# Patient Record
Sex: Male | Born: 1945 | Race: White | Hispanic: No | Marital: Married | Smoking: Current every day smoker
Health system: Southern US, Community
[De-identification: ages and names within clinical notes are randomized; demographics above are authoritative.]

## PROBLEM LIST (undated history)

## (undated) DIAGNOSIS — E7800 Pure hypercholesterolemia, unspecified: Secondary | ICD-10-CM

## (undated) DIAGNOSIS — G894 Chronic pain syndrome: Secondary | ICD-10-CM

## (undated) DIAGNOSIS — E119 Type 2 diabetes mellitus without complications: Secondary | ICD-10-CM

## (undated) DIAGNOSIS — I10 Essential (primary) hypertension: Secondary | ICD-10-CM

## (undated) DIAGNOSIS — M1990 Unspecified osteoarthritis, unspecified site: Secondary | ICD-10-CM

## (undated) HISTORY — DX: Type 2 diabetes mellitus without complications: E11.9

## (undated) HISTORY — DX: Chronic pain syndrome: G89.4

---

## 2002-08-18 ENCOUNTER — Encounter: Payer: Self-pay | Admitting: Orthopaedic Surgery

## 2002-08-18 ENCOUNTER — Ambulatory Visit (HOSPITAL_COMMUNITY): Admission: RE | Admit: 2002-08-18 | Discharge: 2002-08-18 | Payer: Self-pay | Admitting: Orthopaedic Surgery

## 2006-04-18 ENCOUNTER — Encounter: Admission: RE | Admit: 2006-04-18 | Discharge: 2006-04-18 | Payer: Self-pay | Admitting: Family Medicine

## 2008-02-03 ENCOUNTER — Encounter: Admission: RE | Admit: 2008-02-03 | Discharge: 2008-02-03 | Payer: Self-pay | Admitting: Family Medicine

## 2017-12-09 ENCOUNTER — Emergency Department (HOSPITAL_COMMUNITY): Payer: Medicare Other | Admitting: Anesthesiology

## 2017-12-09 ENCOUNTER — Inpatient Hospital Stay (HOSPITAL_COMMUNITY)
Admission: EM | Admit: 2017-12-09 | Discharge: 2017-12-13 | DRG: 493 | Disposition: A | Discharge status: Home Health | Payer: Medicare Other | Attending: Orthopedic Surgery | Admitting: Orthopedic Surgery

## 2017-12-09 ENCOUNTER — Emergency Department (HOSPITAL_COMMUNITY): Payer: Medicare Other

## 2017-12-09 ENCOUNTER — Encounter (HOSPITAL_COMMUNITY): Payer: Self-pay | Admitting: *Deleted

## 2017-12-09 ENCOUNTER — Inpatient Hospital Stay (HOSPITAL_COMMUNITY): Payer: Medicare Other

## 2017-12-09 ENCOUNTER — Other Ambulatory Visit: Payer: Self-pay

## 2017-12-09 ENCOUNTER — Encounter (HOSPITAL_COMMUNITY)
Admission: EM | Disposition: A | Discharge status: Home Health | Payer: Self-pay | Source: Home / Self Care | Attending: Orthopedic Surgery

## 2017-12-09 DIAGNOSIS — T1490XA Injury, unspecified, initial encounter: Secondary | ICD-10-CM

## 2017-12-09 DIAGNOSIS — Z79899 Other long term (current) drug therapy: Secondary | ICD-10-CM

## 2017-12-09 DIAGNOSIS — S82891B Other fracture of right lower leg, initial encounter for open fracture type I or II: Secondary | ICD-10-CM | POA: Diagnosis present

## 2017-12-09 DIAGNOSIS — T148XXA Other injury of unspecified body region, initial encounter: Secondary | ICD-10-CM

## 2017-12-09 DIAGNOSIS — S92211A Displaced fracture of cuboid bone of right foot, initial encounter for closed fracture: Secondary | ICD-10-CM | POA: Diagnosis present

## 2017-12-09 DIAGNOSIS — F17200 Nicotine dependence, unspecified, uncomplicated: Secondary | ICD-10-CM | POA: Diagnosis present

## 2017-12-09 DIAGNOSIS — M25571 Pain in right ankle and joints of right foot: Secondary | ICD-10-CM | POA: Diagnosis present

## 2017-12-09 DIAGNOSIS — S92251B Displaced fracture of navicular [scaphoid] of right foot, initial encounter for open fracture: Secondary | ICD-10-CM | POA: Diagnosis present

## 2017-12-09 DIAGNOSIS — I10 Essential (primary) hypertension: Secondary | ICD-10-CM | POA: Diagnosis present

## 2017-12-09 DIAGNOSIS — S92321A Displaced fracture of second metatarsal bone, right foot, initial encounter for closed fracture: Secondary | ICD-10-CM | POA: Diagnosis present

## 2017-12-09 DIAGNOSIS — R509 Fever, unspecified: Secondary | ICD-10-CM | POA: Diagnosis not present

## 2017-12-09 DIAGNOSIS — W208XXA Other cause of strike by thrown, projected or falling object, initial encounter: Secondary | ICD-10-CM | POA: Diagnosis present

## 2017-12-09 DIAGNOSIS — Y733 Surgical instruments, materials and gastroenterology and urology devices (including sutures) associated with adverse incidents: Secondary | ICD-10-CM

## 2017-12-09 DIAGNOSIS — S82891C Other fracture of right lower leg, initial encounter for open fracture type IIIA, IIIB, or IIIC: Secondary | ICD-10-CM

## 2017-12-09 DIAGNOSIS — S92511B Displaced fracture of proximal phalanx of right lesser toe(s), initial encounter for open fracture: Secondary | ICD-10-CM | POA: Diagnosis present

## 2017-12-09 DIAGNOSIS — S92901B Unspecified fracture of right foot, initial encounter for open fracture: Secondary | ICD-10-CM

## 2017-12-09 DIAGNOSIS — E7800 Pure hypercholesterolemia, unspecified: Secondary | ICD-10-CM | POA: Diagnosis present

## 2017-12-09 DIAGNOSIS — IMO0001 Reserved for inherently not codable concepts without codable children: Secondary | ICD-10-CM

## 2017-12-09 DIAGNOSIS — S82892B Other fracture of left lower leg, initial encounter for open fracture type I or II: Secondary | ICD-10-CM

## 2017-12-09 DIAGNOSIS — S82841B Displaced bimalleolar fracture of right lower leg, initial encounter for open fracture type I or II: Secondary | ICD-10-CM | POA: Diagnosis present

## 2017-12-09 DIAGNOSIS — S92341B Displaced fracture of fourth metatarsal bone, right foot, initial encounter for open fracture: Secondary | ICD-10-CM | POA: Diagnosis present

## 2017-12-09 HISTORY — DX: Pure hypercholesterolemia, unspecified: E78.00

## 2017-12-09 HISTORY — DX: Unspecified osteoarthritis, unspecified site: M19.90

## 2017-12-09 HISTORY — DX: Essential (primary) hypertension: I10

## 2017-12-09 HISTORY — PX: ORIF ANKLE FRACTURE: SHX5408

## 2017-12-09 LAB — CBC WITH DIFFERENTIAL/PLATELET
Basophils Absolute: 0 10*3/uL (ref 0.0–0.1)
Basophils Relative: 0 %
EOS ABS: 0.2 10*3/uL (ref 0.0–0.7)
Eosinophils Relative: 1 %
HEMATOCRIT: 45.5 % (ref 39.0–52.0)
HEMOGLOBIN: 15 g/dL (ref 13.0–17.0)
LYMPHS ABS: 1.7 10*3/uL (ref 0.7–4.0)
Lymphocytes Relative: 11 %
MCH: 31.9 pg (ref 26.0–34.0)
MCHC: 33 g/dL (ref 30.0–36.0)
MCV: 96.8 fL (ref 78.0–100.0)
MONOS PCT: 7 %
Monocytes Absolute: 1.1 10*3/uL — ABNORMAL HIGH (ref 0.1–1.0)
NEUTROS ABS: 12.6 10*3/uL — AB (ref 1.7–7.7)
NEUTROS PCT: 81 %
Platelets: 237 10*3/uL (ref 150–400)
RBC: 4.7 MIL/uL (ref 4.22–5.81)
RDW: 13.1 % (ref 11.5–15.5)
WBC: 15.7 10*3/uL — AB (ref 4.0–10.5)

## 2017-12-09 LAB — COMPREHENSIVE METABOLIC PANEL
ALT: 19 U/L (ref 17–63)
ANION GAP: 12 (ref 5–15)
AST: 30 U/L (ref 15–41)
Albumin: 4.3 g/dL (ref 3.5–5.0)
Alkaline Phosphatase: 71 U/L (ref 38–126)
BUN: 24 mg/dL — ABNORMAL HIGH (ref 6–20)
CHLORIDE: 102 mmol/L (ref 101–111)
CO2: 23 mmol/L (ref 22–32)
Calcium: 9.7 mg/dL (ref 8.9–10.3)
Creatinine, Ser: 1.24 mg/dL (ref 0.61–1.24)
GFR, EST NON AFRICAN AMERICAN: 58 mL/min — AB (ref >60)
Glucose, Bld: 156 mg/dL — ABNORMAL HIGH (ref 65–99)
POTASSIUM: 4.6 mmol/L (ref 3.5–5.1)
SODIUM: 137 mmol/L (ref 135–145)
Total Bilirubin: 1 mg/dL (ref 0.3–1.2)
Total Protein: 7.8 g/dL (ref 6.5–8.1)

## 2017-12-09 LAB — PROTIME-INR
INR: 0.95
PROTHROMBIN TIME: 12.5 s (ref 11.4–15.2)

## 2017-12-09 SURGERY — OPEN REDUCTION INTERNAL FIXATION (ORIF) ANKLE FRACTURE
Anesthesia: Spinal | Site: Ankle | Laterality: Right

## 2017-12-09 MED ORDER — SENNOSIDES-DOCUSATE SODIUM 8.6-50 MG PO TABS: 1.0000 | ORAL_TABLET | Freq: Every evening | ORAL | Status: DC | PRN

## 2017-12-09 MED ORDER — NALOXONE HCL 0.4 MG/ML IJ SOLN: 0.4000 mg | INTRAMUSCULAR | Status: DC | PRN

## 2017-12-09 MED ORDER — ETOMIDATE 2 MG/ML IV SOLN
INTRAVENOUS | Status: AC | PRN
  Administered 2017-12-09: 10 mg via INTRAVENOUS

## 2017-12-09 MED ORDER — BISACODYL 5 MG PO TBEC: 5.0000 mg | DELAYED_RELEASE_TABLET | Freq: Every day | ORAL | Status: DC | PRN

## 2017-12-09 MED ORDER — EPINEPHRINE PF 1 MG/ML IJ SOLN
INTRAMUSCULAR | Status: AC
  Filled 2017-12-09: qty 1

## 2017-12-09 MED ORDER — ROCURONIUM BROMIDE 50 MG/5ML IV SOLN
INTRAVENOUS | Status: AC
  Filled 2017-12-09: qty 1

## 2017-12-09 MED ORDER — LACTATED RINGERS IV SOLN
INTRAVENOUS | Status: DC | PRN
  Administered 2017-12-09 (×2): via INTRAVENOUS

## 2017-12-09 MED ORDER — ETOMIDATE 2 MG/ML IV SOLN
0.3000 mg/kg | Freq: Once | INTRAVENOUS | Status: DC
  Filled 2017-12-09: qty 20

## 2017-12-09 MED ORDER — FENTANYL CITRATE (PF) 100 MCG/2ML IJ SOLN
50.0000 ug | Freq: Once | INTRAMUSCULAR | Status: AC
  Administered 2017-12-09: 50 ug via INTRAVENOUS
  Filled 2017-12-09: qty 2

## 2017-12-09 MED ORDER — MORPHINE SULFATE 15 MG PO TABS: 15.0000 mg | ORAL_TABLET | ORAL | Status: DC | PRN

## 2017-12-09 MED ORDER — ONDANSETRON HCL 4 MG/2ML IJ SOLN
4.0000 mg | Freq: Once | INTRAMUSCULAR | Status: AC
  Administered 2017-12-09: 4 mg via INTRAVENOUS
  Filled 2017-12-09: qty 2

## 2017-12-09 MED ORDER — LISINOPRIL 10 MG PO TABS
10.0000 mg | ORAL_TABLET | Freq: Every day | ORAL | Status: DC
  Administered 2017-12-11 – 2017-12-13 (×3): 10 mg via ORAL
  Filled 2017-12-09 (×4): qty 1

## 2017-12-09 MED ORDER — MORPHINE SULFATE ER 30 MG PO TBCR
30.0000 mg | EXTENDED_RELEASE_TABLET | Freq: Two times a day (BID) | ORAL | Status: DC
  Administered 2017-12-09 – 2017-12-13 (×8): 30 mg via ORAL
  Filled 2017-12-09 (×8): qty 1

## 2017-12-09 MED ORDER — MIDAZOLAM HCL 5 MG/5ML IJ SOLN
INTRAMUSCULAR | Status: DC | PRN
  Administered 2017-12-09 (×2): .5 mg via INTRAVENOUS
  Administered 2017-12-09: 1 mg via INTRAVENOUS

## 2017-12-09 MED ORDER — LIDOCAINE HCL (PF) 1 % IJ SOLN
INTRAMUSCULAR | Status: AC
  Filled 2017-12-09: qty 5

## 2017-12-09 MED ORDER — STERILE WATER FOR IRRIGATION IR SOLN
Status: DC | PRN
  Administered 2017-12-09: 2000 mL

## 2017-12-09 MED ORDER — CEFAZOLIN SODIUM-DEXTROSE 1-4 GM/50ML-% IV SOLN
1.0000 g | Freq: Once | INTRAVENOUS | Status: AC
  Administered 2017-12-09: 1 g via INTRAVENOUS

## 2017-12-09 MED ORDER — SODIUM CHLORIDE 0.9 % IJ SOLN
INTRAMUSCULAR | Status: AC
  Filled 2017-12-09: qty 10

## 2017-12-09 MED ORDER — CEFAZOLIN SODIUM-DEXTROSE 1-4 GM/50ML-% IV SOLN
1.0000 g | Freq: Once | INTRAVENOUS | Status: AC
  Administered 2017-12-09: 1 g via INTRAVENOUS
  Filled 2017-12-09: qty 50

## 2017-12-09 MED ORDER — DOCUSATE SODIUM 100 MG PO CAPS
100.0000 mg | ORAL_CAPSULE | Freq: Two times a day (BID) | ORAL | Status: DC
  Administered 2017-12-10 – 2017-12-13 (×7): 100 mg via ORAL
  Filled 2017-12-09 (×7): qty 1

## 2017-12-09 MED ORDER — LIDOCAINE HCL (CARDIAC) 10 MG/ML IV SOLN
INTRAVENOUS | Status: DC | PRN
  Administered 2017-12-09: 30 mg via INTRAVENOUS

## 2017-12-09 MED ORDER — BUPIVACAINE IN DEXTROSE 0.75-8.25 % IT SOLN
INTRATHECAL | Status: DC | PRN
  Administered 2017-12-09: 15 mg via INTRATHECAL

## 2017-12-09 MED ORDER — MORPHINE SULFATE 15 MG PO TABS: 15.0000 mg | ORAL_TABLET | Freq: Every day | ORAL | Status: DC

## 2017-12-09 MED ORDER — SODIUM CHLORIDE 0.9 % IV BOLUS (SEPSIS)
500.0000 mL | INTRAVENOUS | Status: DC | PRN
  Administered 2017-12-10 – 2017-12-11 (×2): 500 mL via INTRAVENOUS

## 2017-12-09 MED ORDER — POVIDONE-IODINE 10 % EX SWAB: 2.0000 "application " | Freq: Once | CUTANEOUS | Status: DC

## 2017-12-09 MED ORDER — SUCCINYLCHOLINE CHLORIDE 20 MG/ML IJ SOLN
INTRAMUSCULAR | Status: AC
  Filled 2017-12-09: qty 1

## 2017-12-09 MED ORDER — CEFAZOLIN SODIUM 1 G IJ SOLR
1.0000 g | Freq: Once | INTRAMUSCULAR | Status: DC
  Filled 2017-12-09: qty 10

## 2017-12-09 MED ORDER — 0.9 % SODIUM CHLORIDE (POUR BTL) OPTIME
TOPICAL | Status: DC | PRN
  Administered 2017-12-09: 5000 mL

## 2017-12-09 MED ORDER — SODIUM CHLORIDE 0.9 % IV SOLN
INTRAVENOUS | Status: DC
  Administered 2017-12-09 – 2017-12-13 (×6): via INTRAVENOUS

## 2017-12-09 MED ORDER — PROMETHAZINE HCL 12.5 MG PO TABS
12.5000 mg | ORAL_TABLET | Freq: Four times a day (QID) | ORAL | Status: DC | PRN
  Filled 2017-12-09: qty 1

## 2017-12-09 MED ORDER — BUPIVACAINE IN DEXTROSE 0.75-8.25 % IT SOLN
INTRATHECAL | Status: AC
  Filled 2017-12-09: qty 2

## 2017-12-09 MED ORDER — HYDROCHLOROTHIAZIDE 25 MG PO TABS
25.0000 mg | ORAL_TABLET | Freq: Every day | ORAL | Status: DC
  Administered 2017-12-11 – 2017-12-13 (×3): 25 mg via ORAL
  Filled 2017-12-09 (×4): qty 1

## 2017-12-09 MED ORDER — FENTANYL CITRATE (PF) 100 MCG/2ML IJ SOLN
INTRAMUSCULAR | Status: AC
  Filled 2017-12-09: qty 2

## 2017-12-09 MED ORDER — SODIUM CHLORIDE 0.9 % IV BOLUS (SEPSIS)
500.0000 mL | Freq: Once | INTRAVENOUS | Status: AC
  Administered 2017-12-09: 500 mL via INTRAVENOUS

## 2017-12-09 MED ORDER — FENTANYL CITRATE (PF) 100 MCG/2ML IJ SOLN
INTRAMUSCULAR | Status: DC | PRN
  Administered 2017-12-09 (×2): 25 ug via INTRAVENOUS
  Administered 2017-12-09: 15 ug via INTRATHECAL
  Administered 2017-12-09: 25 ug via INTRAVENOUS
  Administered 2017-12-09: 10 ug via INTRAVENOUS
  Administered 2017-12-09 (×4): 25 ug via INTRAVENOUS

## 2017-12-09 MED ORDER — FENTANYL CITRATE (PF) 100 MCG/2ML IJ SOLN
12.5000 ug | INTRAMUSCULAR | Status: DC | PRN
  Administered 2017-12-09 – 2017-12-10 (×4): 12.5 ug via INTRAVENOUS
  Administered 2017-12-10: 6.25 ug via INTRAVENOUS
  Filled 2017-12-09 (×6): qty 2

## 2017-12-09 MED ORDER — PROPOFOL 500 MG/50ML IV EMUL
INTRAVENOUS | Status: DC | PRN
  Administered 2017-12-09: 75 ug/kg/min via INTRAVENOUS

## 2017-12-09 MED ORDER — ENOXAPARIN SODIUM 30 MG/0.3ML ~~LOC~~ SOLN
30.0000 mg | SUBCUTANEOUS | Status: DC
  Administered 2017-12-10 – 2017-12-11 (×2): 30 mg via SUBCUTANEOUS
  Filled 2017-12-09 (×4): qty 0.3

## 2017-12-09 MED ORDER — GABAPENTIN 100 MG PO CAPS
200.0000 mg | ORAL_CAPSULE | Freq: Three times a day (TID) | ORAL | Status: DC
  Administered 2017-12-09 – 2017-12-13 (×11): 200 mg via ORAL
  Filled 2017-12-09 (×11): qty 2

## 2017-12-09 MED ORDER — SODIUM CHLORIDE 0.9 % IV SOLN
INTRAVENOUS | Status: DC
  Administered 2017-12-09: 11:00:00 via INTRAVENOUS

## 2017-12-09 MED ORDER — EPINEPHRINE PF 1 MG/10ML IJ SOSY
PREFILLED_SYRINGE | INTRAMUSCULAR | Status: DC | PRN
  Administered 2017-12-09: .1 ug via INTRATRACHEAL

## 2017-12-09 MED ORDER — PROPOFOL 10 MG/ML IV BOLUS
INTRAVENOUS | Status: AC
  Filled 2017-12-09: qty 20

## 2017-12-09 MED ORDER — MIDAZOLAM HCL 2 MG/2ML IJ SOLN
INTRAMUSCULAR | Status: AC
  Filled 2017-12-09: qty 2

## 2017-12-09 MED ORDER — BUPIVACAINE-EPINEPHRINE (PF) 0.5% -1:200000 IJ SOLN
INTRAMUSCULAR | Status: AC
  Filled 2017-12-09: qty 60

## 2017-12-09 MED ORDER — ACETAMINOPHEN 500 MG PO TABS
500.0000 mg | ORAL_TABLET | ORAL | Status: DC
  Administered 2017-12-09 – 2017-12-13 (×21): 500 mg via ORAL
  Filled 2017-12-09 (×20): qty 1

## 2017-12-09 MED ORDER — CEFAZOLIN SODIUM-DEXTROSE 1-4 GM/50ML-% IV SOLN
INTRAVENOUS | Status: AC
  Filled 2017-12-09: qty 50

## 2017-12-09 MED ORDER — NICOTINE 14 MG/24HR TD PT24
14.0000 mg | MEDICATED_PATCH | Freq: Every day | TRANSDERMAL | Status: DC
  Administered 2017-12-09 – 2017-12-13 (×5): 14 mg via TRANSDERMAL
  Filled 2017-12-09 (×5): qty 1

## 2017-12-09 MED ORDER — IBUPROFEN 400 MG PO TABS
400.0000 mg | ORAL_TABLET | Freq: Four times a day (QID) | ORAL | Status: DC
  Administered 2017-12-10 – 2017-12-13 (×14): 400 mg via ORAL
  Filled 2017-12-09 (×15): qty 1

## 2017-12-09 MED ORDER — CHLORHEXIDINE GLUCONATE 4 % EX LIQD
60.0000 mL | Freq: Once | CUTANEOUS | Status: DC
  Filled 2017-12-09: qty 60

## 2017-12-09 MED ORDER — PROPOFOL 10 MG/ML IV BOLUS
INTRAVENOUS | Status: DC | PRN
  Administered 2017-12-09: 20 mg via INTRAVENOUS

## 2017-12-09 MED ORDER — NICOTINE POLACRILEX 2 MG MT GUM
2.0000 mg | CHEWING_GUM | OROMUCOSAL | Status: DC | PRN
  Filled 2017-12-09: qty 1

## 2017-12-09 MED ORDER — BUPIVACAINE-EPINEPHRINE (PF) 0.5% -1:200000 IJ SOLN
INTRAMUSCULAR | Status: DC | PRN
  Administered 2017-12-09: 30 mL via PERINEURAL

## 2017-12-09 MED ORDER — EPHEDRINE SULFATE 50 MG/ML IJ SOLN
INTRAMUSCULAR | Status: AC
  Filled 2017-12-09: qty 1

## 2017-12-09 MED ORDER — EPHEDRINE SULFATE 50 MG/ML IJ SOLN
INTRAMUSCULAR | Status: DC | PRN
  Administered 2017-12-09 (×2): 5 mg via INTRAVENOUS
  Administered 2017-12-09 (×2): 10 mg via INTRAVENOUS
  Administered 2017-12-09: 5 mg via INTRAVENOUS

## 2017-12-09 MED ORDER — ATORVASTATIN CALCIUM 40 MG PO TABS
40.0000 mg | ORAL_TABLET | Freq: Every day | ORAL | Status: DC
  Administered 2017-12-09 – 2017-12-12 (×4): 40 mg via ORAL
  Filled 2017-12-09 (×4): qty 1

## 2017-12-09 MED ORDER — ONDANSETRON HCL 4 MG/2ML IJ SOLN
INTRAMUSCULAR | Status: AC
  Filled 2017-12-09: qty 2

## 2017-12-09 SURGICAL SUPPLY — 72 items
BANDAGE ELASTIC 3 LF NS (GAUZE/BANDAGES/DRESSINGS) ×3 IMPLANT
BANDAGE ELASTIC 4 LF NS (GAUZE/BANDAGES/DRESSINGS) ×6 IMPLANT
BANDAGE ESMARK 4X12 BL STRL LF (DISPOSABLE) ×1 IMPLANT
BIT DRILL 3.5X122MM AO FIT (BIT) IMPLANT
BIT DRILL CANN 2.7 (BIT) ×1
BIT DRILL CANN 2.7MM (BIT) ×1
BIT DRILL SRG 2.7XCANN AO CPLG (BIT) ×1 IMPLANT
BIT DRL SRG 2.7XCANN AO CPLNG (BIT) ×1
BLADE SURG SZ10 CARB STEEL (BLADE) ×3 IMPLANT
BNDG COHESIVE 4X5 TAN STRL (GAUZE/BANDAGES/DRESSINGS) IMPLANT
BNDG ESMARK 4X12 BLUE STRL LF (DISPOSABLE) ×3
CHLORAPREP W/TINT 26ML (MISCELLANEOUS) ×6 IMPLANT
CLOTH BEACON ORANGE TIMEOUT ST (SAFETY) ×3 IMPLANT
COVER LIGHT HANDLE STERIS (MISCELLANEOUS) ×6 IMPLANT
CUFF TOURNIQUET SINGLE 34IN LL (TOURNIQUET CUFF) ×3 IMPLANT
CUFF TOURNIQUET SINGLE 44IN (TOURNIQUET CUFF) IMPLANT
DECANTER SPIKE VIAL GLASS SM (MISCELLANEOUS) ×6 IMPLANT
DRAPE C-ARM FOLDED MOBILE STRL (DRAPES) ×3 IMPLANT
DRAPE PROXIMA HALF (DRAPES) ×3 IMPLANT
DRESSING XEROFORM 5X9 (GAUZE/BANDAGES/DRESSINGS) ×6 IMPLANT
DRILL 2.6X122MM WL AO SHAFT (BIT) ×3 IMPLANT
GAUZE KERLIX 2X3 DERM STRL LF (GAUZE/BANDAGES/DRESSINGS) ×6 IMPLANT
GAUZE SPONGE 4X4 12PLY STRL (GAUZE/BANDAGES/DRESSINGS) ×6 IMPLANT
GAUZE XEROFORM 5X9 LF (GAUZE/BANDAGES/DRESSINGS) ×6 IMPLANT
GLOVE BIOGEL PI IND STRL 7.0 (GLOVE) ×2 IMPLANT
GLOVE BIOGEL PI INDICATOR 7.0 (GLOVE) ×4
GLOVE SKINSENSE NS SZ8.0 LF (GLOVE) ×4
GLOVE SKINSENSE STRL SZ8.0 LF (GLOVE) ×2 IMPLANT
GLOVE SS N UNI LF 8.5 STRL (GLOVE) ×3 IMPLANT
GOWN STRL REUS W/TWL LRG LVL3 (GOWN DISPOSABLE) ×6 IMPLANT
GOWN STRL REUS W/TWL XL LVL3 (GOWN DISPOSABLE) ×3 IMPLANT
INST SET MINOR BONE (KITS) ×3 IMPLANT
K-WIRE 1.4X100 (WIRE)
K-WIRE 1.6X150 (WIRE)
K-WIRE FX150X1.6XKRSH (WIRE)
K-WIRE ORTHOPEDIC 1.4X150L (WIRE) ×6
K-WIRE SMOOTH 2.0X150 (WIRE)
KIT ROOM TURNOVER APOR (KITS) ×3 IMPLANT
KWIRE 1.4X100 (WIRE) IMPLANT
KWIRE FX150X1.6XKRSH (WIRE) IMPLANT
KWIRE ORTHOPEDIC 1.4X150L (WIRE) ×2 IMPLANT
KWIRE SMOOTH 2.0X150 (WIRE) IMPLANT
MANIFOLD NEPTUNE II (INSTRUMENTS) ×3 IMPLANT
NEEDLE HYPO 21X1.5 SAFETY (NEEDLE) ×3 IMPLANT
NS IRRIG 1000ML POUR BTL (IV SOLUTION) ×15 IMPLANT
PACK BASIC LIMB (CUSTOM PROCEDURE TRAY) ×3 IMPLANT
PAD ABD 5X9 TENDERSORB (GAUZE/BANDAGES/DRESSINGS) ×6 IMPLANT
PAD ARMBOARD 7.5X6 YLW CONV (MISCELLANEOUS) ×3 IMPLANT
PAD CAST 4YDX4 CTTN HI CHSV (CAST SUPPLIES) ×1 IMPLANT
PADDING CAST COTTON 4X4 STRL (CAST SUPPLIES) ×2
PLATE 8H 108MM (Plate) ×3 IMPLANT
SCREW 46X4.0MM (Screw) ×6 IMPLANT
SCREW BONE 14MMX3.5MM (Screw) ×6 IMPLANT
SCREW BONE 3.5X16MM (Screw) ×3 IMPLANT
SCREW BONE NON-LCKING 3.5X12MM (Screw) ×3 IMPLANT
SCREW LOCK 3.5X14 (Screw) ×3 IMPLANT
SCREW LOCKING 3.5X16MM (Screw) ×3 IMPLANT
SCREW LOCKING 3.5X18MM (Screw) ×6 IMPLANT
SCREW NONLOCK 22MM (Screw) ×3 IMPLANT
SET BASIN LINEN APH (SET/KITS/TRAYS/PACK) ×3 IMPLANT
SPLINT J IMMOBILIZER 4X20FT (CAST SUPPLIES) ×1 IMPLANT
SPLINT J PLASTER J 4INX20Y (CAST SUPPLIES) ×2
SPONGE LAP 18X18 X RAY DECT (DISPOSABLE) ×6 IMPLANT
STAPLER VISISTAT 35W (STAPLE) ×6 IMPLANT
SUT ETHIBOND NAB OS 4 #2 30IN (SUTURE) ×6 IMPLANT
SUT MON AB 0 CT1 (SUTURE) ×3 IMPLANT
SUT MON AB 2-0 CT1 36 (SUTURE) ×6 IMPLANT
SYR 30ML LL (SYRINGE) ×3 IMPLANT
SYR BULB IRRIGATION 50ML (SYRINGE) ×3 IMPLANT
TAPE CAST 2X4 WHT DELT L NS LF (GAUZE/BANDAGES/DRESSINGS) ×3 IMPLANT
WATER STERILE IRR 1000ML POUR (IV SOLUTION) ×3 IMPLANT
WATER STERILE IRR 500ML POUR (IV SOLUTION) ×6 IMPLANT

## 2017-12-09 NOTE — Anesthesia Procedure Notes (Signed)
Spinal  Patient location during procedure: OR Start time: 12/09/2017 4:13 PM End time: 12/09/2017 4:16 PM Staffing Resident/CRNA: Franco NonesYates, Vylet Maffia S, CRNA Preanesthetic Checklist Completed: patient identified, site marked, surgical consent, pre-op evaluation, timeout performed, IV checked, risks and benefits discussed and monitors and equipment checked Spinal Block Patient position: right lateral decubitus Prep: Betadine Patient monitoring: heart rate, cardiac monitor, continuous pulse ox and blood pressure Approach: right paramedian Location: L3-4 Injection technique: single-shot Needle Needle type: Spinocan  Needle gauge: 22 G Needle length: 9 cm Assessment Sensory level: T6 Events: clear Additional Notes ATTEMPTS: 1 TRAY IKerin Ransom: GTI: [id number]: [id number] LOT: [phone]  TRAY EXPIRATION DATE: 2020-02-21

## 2017-12-09 NOTE — ED Notes (Signed)
Consent signed and timeout performed.

## 2017-12-09 NOTE — Progress Notes (Signed)
Dr. Romeo AppleHarrison called Rn to see how pts BP was doing, adv it had not been rechecked again but did say son was in hallway to tell RN that pts pain is increasing worse.  Adv Dr. Romeo AppleHarrison I would page him and let him know the pts BP-106/62. MD made aware

## 2017-12-09 NOTE — ED Triage Notes (Signed)
Pt was chopping wood outside and a tree fell back on his right ankle/foot. Pt took 2 Hydrocodone prior to coming to ED. Pt has obvious deformity of right ankle. Pt denies hitting his head or LOC.

## 2017-12-09 NOTE — Anesthesia Preprocedure Evaluation (Addendum)
Anesthesia Evaluation  Patient identified by MRN, date of birth, ID band Patient awake    Reviewed: Allergy & Precautions, NPO status , Patient's Chart, lab work & pertinent test results  Airway Mallampati: III  TM Distance: >3 FB Neck ROM: Limited  Mouth opening: Limited Mouth Opening  Dental  (+) Edentulous Upper, Edentulous Lower   Pulmonary Current Smoker,    breath sounds clear to auscultation       Cardiovascular hypertension, Pt. on medications  Rate:Normal     Neuro/Psych    GI/Hepatic   Endo/Other    Renal/GU      Musculoskeletal  (+) Arthritis ,   Abdominal   Peds  Hematology   Anesthesia Other Findings   Reproductive/Obstetrics                             Anesthesia Physical Anesthesia Plan  ASA: II and emergent  Anesthesia Plan: Spinal   Post-op Pain Management:    Induction:   PONV Risk Score and Plan:   Airway Management Planned: Simple Face Mask  Additional Equipment:   Intra-op Plan:   Post-operative Plan:   Informed Consent: I have reviewed the patients History and Physical, chart, labs and discussed the procedure including the risks, benefits and alternatives for the proposed anesthesia with the patient or authorized representative who has indicated his/her understanding and acceptance.   Dental advisory given  Plan Discussed with: Anesthesiologist and Surgeon  Anesthesia Plan Comments:        Anesthesia Quick Evaluation

## 2017-12-09 NOTE — Anesthesia Postprocedure Evaluation (Signed)
Anesthesia Post Note  Patient: Russell Luna  Procedure(s) Performed: OPEN REDUCTION INTERNAL FIXATION (ORIF) ANKLE FRACTURE (Right Ankle)  Patient location during evaluation: PACU Anesthesia Type: Spinal Level of consciousness: awake and alert Pain management: satisfactory to patient Vital Signs Assessment: post-procedure vital signs reviewed and stable Respiratory status: spontaneous breathing Cardiovascular status: stable Postop Assessment: spinal receding and no apparent nausea or vomiting Anesthetic complications: no     Last Vitals:  Vitals:   12/09/17 1945 12/09/17 2000  BP: (!) 80/48 (!) 88/53  Pulse: 83 87  Resp: 16 10  Temp:    SpO2: 93% 96%    Last Pain:  Vitals:   12/09/17 1419  TempSrc:   PainSc: 7                  Russell Luna

## 2017-12-09 NOTE — ED Notes (Signed)
Pt a/o  

## 2017-12-09 NOTE — ED Provider Notes (Signed)
[facility] EMERGENCY DEPARTMENT Provider Note   CSN: [id number] Arrival date & time: 12/09/17  1053     History   Chief Complaint Chief Complaint  Patient presents with  . Ankle Pain    HPI Russell Luna is a 71 y.o. male.  Patient using chainsaw to cut up with down tree.  Sure he rolled over on his right ankle and foot.  Patient brought in by his wife.  Patient's past medical history significant for high cholesterol and hypertension.  Patient also has a history of chronic back pain and neck pain.  No other injuries other than to his right ankle and foot.  Extensive injuries there are obvious soft tissue lacerations deformity of the ankle foot so clearly a dislocation and there is a 6 cm open wound on the medial aspect of the ankle that most likely represents open fracture or open dislocation.  Patient's cap refill is fine.  Patient is not on blood thinners.  Patient states his tetanus is up-to-date.  Patient with complaint of back pain but no worse after the injury.  This is baseline back pain for him.      Past Medical History:  Diagnosis Date  . Arthritis   . High cholesterol   . Hypertension     There are no active problems to display for this patient.   History reviewed. No pertinent surgical history.     Home Medications    Prior to Admission medications   Not on File    Family History No family history on file.  Social History Social History   Tobacco Use  . Smoking status: Smoker, Current Status Unknown  . Smokeless tobacco: Never Used  Substance Use Topics  . Alcohol use: No    Frequency: Never  . Drug use: No     Allergies   Patient has no known allergies.   Review of Systems Review of Systems  Constitutional: Negative for fever.  HENT: Negative for congestion.   Eyes: Negative for redness.  Respiratory: Negative for shortness of breath.   Cardiovascular: Negative for chest pain.  Gastrointestinal: Negative for abdominal pain,  nausea and vomiting.  Genitourinary: Negative for dysuria.  Musculoskeletal: Positive for back pain and joint swelling. Negative for neck pain.  Skin: Positive for wound.  Neurological: Negative for headaches.  Hematological: Does not bruise/bleed easily.  Psychiatric/Behavioral: Negative for confusion.     Physical Exam Updated Vital Signs BP 129/70   Pulse 82   Temp 97.7 F (36.5 C) (Oral)   Resp (!) 9   Wt 93 kg (205 lb)   SpO2 98%   Physical Exam  Constitutional: He is oriented to person, place, and time. He appears well-developed and well-nourished. No distress.  HENT:  Head: Normocephalic and atraumatic.  Mouth/Throat: Oropharynx is clear and moist.  Eyes: Conjunctivae and EOM are normal. Pupils are equal, round, and reactive to light.  Neck: Normal range of motion. Neck supple.  Cardiovascular: Normal rate, regular rhythm and normal heart sounds.  Pulmonary/Chest: Effort normal and breath sounds normal.  Abdominal: Soft. Bowel sounds are normal.  Musculoskeletal: Normal range of motion. He exhibits tenderness and deformity.  Obvious dislocation and probable fracture of right ankle.  6 cm medial malleolus laceration seems to extend into the joint.  Multiple lacerations to the lateral aspect of the right foot.  Also appears to be some open fractures at the base of the fourth and fifth toe.  Patient has good cap refill.  Patient has  good movement of the toes.  Sensation grossly intact.  No obvious proximal injuries.  No other extremity injuries.  Neurological: He is alert and oriented to person, place, and time. No cranial nerve deficit or sensory deficit. He exhibits normal muscle tone. Coordination normal.  Skin: Skin is warm.  Nursing note and vitals reviewed.    ED Treatments / Results  Labs (all labs ordered are listed, but only abnormal results are displayed) Labs Reviewed  COMPREHENSIVE METABOLIC PANEL - Abnormal; Notable for the following components:      Result  Value   Glucose, Bld 156 (*)    BUN 24 (*)    GFR calc non Af Amer 58 (*)    All other components within normal limits  CBC WITH DIFFERENTIAL/PLATELET - Abnormal; Notable for the following components:   WBC 15.7 (*)    Neutro Abs 12.6 (*)    Monocytes Absolute 1.1 (*)    All other components within normal limits  PROTIME-INR    EKG  EKG Interpretation  Date/Time:  Monday December 09 2017 11:22:45 EST Ventricular Rate:  94 PR Interval:    QRS Duration: 72 QT Interval:  326 QTC Calculation: 408 R Axis:   77 Text Interpretation:  Sinus rhythm No previous ECGs available Confirmed by Vanetta MuldersZackowski, Maggie Senseney [phone]([id number]) on 12/09/2017 12:09:26 PM       Radiology Dg Chest Port 1 View  Result Date: 12/09/2017 CLINICAL DATA:  Tree fell on leg today. EXAM: PORTABLE CHEST 1 VIEW COMPARISON:  02/03/2008 FINDINGS: The cardiac silhouette, mediastinal and hilar contours are within normal limits and stable. There is tortuosity and calcification of the thoracic aorta. The lungs are clear. No pleural effusion. No pneumothorax. The bony thorax is grossly intact. IMPRESSION: No acute cardiopulmonary findings and intact bony thorax. Electronically Signed   By: Rudie MeyerP.  Gallerani M.D.   On: 12/09/2017 12:07   Dg Ankle Right Port  Result Date: 12/09/2017 CLINICAL DATA:  Right ankle pain after injury today. EXAM: PORTABLE RIGHT ANKLE - 2 VIEW COMPARISON:  None. FINDINGS: There is severe displacement of the talus laterally relative to the distal tibia. Severely displaced medial malleolar fracture is noted as well a severely comminuted and displaced fracture involving the distal fibula. IMPRESSION: Severely displaced medial malleolar fracture is noted as well severely displaced and comminuted distal right fibular fracture. Severe displacement of talus laterally relative to distal tibia is noted. Electronically Signed   By: Lupita RaiderJames  Green Jr, M.D.   On: 12/09/2017 12:09   Dg Foot 2 Views Right  Result Date:  12/09/2017 CLINICAL DATA:  Tree fell on foot. EXAM: RIGHT FOOT - 2 VIEW COMPARISON:  None. FINDINGS: There are fractures of the fourth and fifth proximal phalanges. Nondisplaced fracture involving the second metatarsal neck and distal shaft. Slight widening of the distance between the bases of the first and second metatarsals and also between the medial and middle cuneiforms. Possible fracture involving the base of the second metatarsal. Findings worrisome for a Lisfranc injury. The possible fracture involving the medial cuneiform. Other midfoot fractures are possible. Recommend CT for further evaluation. Complex ankle fractures are noted. IMPRESSION: 1. Fractures of the fourth and fifth proximal phalanges. 2. Nondisplaced fracture involving the second metatarsal neck and distal shaft. 3. Suspect fracture involving the base of the second metatarsal and medial cuneiform with Lisfranc injury. Recommend CT for further evaluation. Electronically Signed   By: Rudie MeyerP.  Gallerani M.D.   On: 12/09/2017 12:13    Procedures Reduction of dislocation Date/Time: 12/09/2017 2:37  PM Performed by: Vanetta MuldersZackowski, Maximum Reiland, MD Authorized by: Vanetta MuldersZackowski, Haylen Shelnutt, MD  Consent: The procedure was performed in an emergent situation. Written consent obtained. Risks and benefits: risks, benefits and alternatives were discussed Consent given by: patient Patient understanding: patient states understanding of the procedure being performed Patient consent: the patient's understanding of the procedure matches consent given Procedure consent: procedure consent matches procedure scheduled Relevant documents: relevant documents present and verified Imaging studies: imaging studies available Required items: required blood products, implants, devices, and special equipment available Patient identity confirmed: verbally with patient and arm band Time out: Immediately prior to procedure a "time out" was called to verify the correct patient,  procedure, equipment, support staff and site/side marked as required. Local anesthesia used: no  Anesthesia: Local anesthesia used: no  Sedation: Patient sedated: yes Sedation type: moderate (conscious) sedation Sedatives: etomidate Analgesia: fentanyl Sedation start date/time: 12/09/2017 12:25 PM Sedation end date/time: 12/09/2017 1:00 PM Vitals: Vital signs were monitored during sedation.  Patient tolerance: Patient tolerated the procedure well with no immediate complications    (including critical care time)  Medications Ordered in ED Medications  0.9 %  sodium chloride infusion ( Intravenous New Bag/Given 12/09/17 1127)  etomidate (AMIDATE) injection 27.9 mg (27.9 mg Intravenous Not Given 12/09/17 1232)  sodium chloride 0.9 % bolus 500 mL (0 mLs Intravenous Stopped 12/09/17 1237)  ondansetron (ZOFRAN) injection 4 mg (4 mg Intravenous Given 12/09/17 1121)  fentaNYL (SUBLIMAZE) injection 50 mcg (50 mcg Intravenous Given 12/09/17 1121)  ceFAZolin (ANCEF) IVPB 1 g/50 mL premix (0 g Intravenous Stopped 12/09/17 1211)  fentaNYL (SUBLIMAZE) injection 50 mcg (50 mcg Intravenous Given 12/09/17 1150)  etomidate (AMIDATE) injection (10 mg Intravenous Given 12/09/17 1224)     Initial Impression / Assessment and Plan / ED Course  I have reviewed the triage vital signs and the nursing notes.  Pertinent labs & imaging results that were available during my care of the patient were reviewed by me and considered in my medical decision making (see chart for details).   Patient with significant injuries to the right foot and ankle.  X-rays reveal the by lateral malleolar ankle fracture should be open fracture also dislocation of the talus out of the mortise.  Fractures to the foot as well.  And suspicious concerns for possible fracture of the talus.  Patient was sedated with etomidate and the dislocation was reduced and patient was placed in a stirrup splint and posterior splint.  Wounds were  covered after being cleaned with nonadherent dressing.  Then wrapped with Kerlix gauze and an Ace wrap.  Reduction without any difficulties.  Patient received 1 g of Ancef.  Discussed with on-call orthopedics Dr. Romeo AppleHarrison.  He will come in to take care of the open fractures.  Requested that we get CT of the foot.  That has been ordered.  Patient much more comfortable after the dislocation was reduced.    Final Clinical Impressions(s) / ED Diagnoses   Final diagnoses:  Injury  Type III open fracture of right ankle, initial encounter  Foot fracture, right, open, initial encounter    ED Discharge Orders    None       Vanetta MuldersZackowski, Kameren Baade, MD 12/09/17 1440

## 2017-12-09 NOTE — Anesthesia Procedure Notes (Signed)
Procedure Name: MAC Date/Time: 12/09/2017 3:15 PM Performed by: Vista Deck, CRNA Pre-anesthesia Checklist: Patient identified, Emergency Drugs available, Suction available, Timeout performed and Patient being monitored Patient Re-evaluated:Patient Re-evaluated prior to induction Oxygen Delivery Method: Nasal Cannula

## 2017-12-09 NOTE — Op Note (Signed)
12/09/2017  6:58 PM  PATIENT:  Russell Luna  71 y.o. male  PRE-OPERATIVE DIAGNOSIS:  open right ankle bimalleolar fracture  POST-OPERATIVE DIAGNOSIS:  open right ankle bimalleolar fracture  Multiple fractures of the foot including open fracture of the proximal phalanx of the third fourth and fifth  Second metatarsal fracture at the neck distal shaft and base closed  Open fourth metatarsal fracture distally  Navicular avulsion fracture cuboid and calcaneus fracture, calcaneus fracture not intra-articular  PROCEDURE:  Procedure(s) with comments: Irrigation and debridement and OPEN REDUCTION INTERNAL FIXATION (ORIF) ANKLE  IRRIGATION AND DEBRIDEMENT AND OPEN TREATMENT OF THIRD FOURTH AND FIFTH PROXIMAL PHALANX FRACTURES AND OPEN FOURTH METATARSAL FRACTURE  Right foot  Operative findings Displaced open medial malleolus fracture  Displaced open lateral malleolus fracture with severe comminution of the distal fibula  Rupture of the anterolateral ankle joint capsule and ligament between the anteroinferior tibia and fibula  Lacerations were as follows  6 cm laceration medially 14 cm laceration lateral foot 2 cm laceration lateral ankle at the fracture site of the lateral malleolus 3 cm laceration between third and fourth digit 4 cm laceration between fourth and fifth digit   Operative procedure was performed as follows Patient was evaluated in the preop area surgical site marked  Patient taken to surgery for spinal anesthesia  Tourniquet was applied to the right thigh splint was removed.  The ankle was prepped and draped sterilely with Betadine  Timeout was taken  The limb was exsanguinated with a 4 inch Esmarch tourniquet was elevated to 300 mmHg  The wounds of the foot were wrapped and kept out of the surgical field  A lateral incision was made anteriorly along the fibula and this was taken down to bone and retracted medially and laterally as needed to get  exposure  The fractured fibula was comminuted with avulsion of the anterior inferior tib-fib ligament  The fracture was irrigated and reduced and a lateral plate was applied taking C-arm shots prior to inserting any screws.  Radiographs were taken after the plate was placed using AO technique starting with nonlocking screws and using some distal locking screws where the bone was comminuted.  I then open the medial side extending the incision and irrigated the wound again irrigated the joint reduced the medial malleolus confirm reduction with x-ray and K wires  I then overdrilled the K wires up placing 2 4.0 cannulated screws  Radiographs and stress testing was performed.  There was subluxation of the talus  I then went back to the lateral side and noted that the fracture was not stable.  I therefore remove the plate reproduce the fracture took a C-arm x-ray confirmed reduction and reapplied the plate using nonlocking and locking screws.  I then took 2 #2 Ethibond sutures and repaired the anterior and anterolateral ankle joint capsule including the anterior inferior tib-fib ligament  After stress testing and further x-rays this gave a stable ankle joint.  (The reason for the subluxation was at the distal fibula was not included in the initial plating secondary to it being mistaken for posterior comminution)  I then covered those wounds and then address the foot.  We did copious amounts of irrigation there and closed those wounds loosely with 2-0 Monocryl and staples  I then reirrigated the lateral and medial wound and closed those wounds with 0 Monocryl laterally 2-0 Monocryl medially and staples  Portions of the medial wound, lateral foot wound interdigital wounds and lateral wound was left open  I did inject some Marcaine into the foot.  I did not inject any medially I injected some laterally  Sterile dressings and posterior and sugar tong splint were applied after final radiographs  were obtained confirming reduction of them ankle mortise and ankle joint.  The patient was taken to recovery room in stable condition  In the recovery room I checked his foot and his third fourth and fifth digit did show some discoloration but capillary refill although sluggish was present and I do expect this to come back to normal with the exception of the small toe which may or may not survive and the family has been made aware

## 2017-12-09 NOTE — Transfer of Care (Signed)
Immediate Anesthesia Transfer of Care Note  Patient: Russell Luna  Procedure(s) Performed: OPEN REDUCTION INTERNAL FIXATION (ORIF) ANKLE FRACTURE (Right Ankle)  Patient Location: PACU  Anesthesia Type:Spinal  Level of Consciousness: awake and alert   Airway & Oxygen Therapy: Patient Spontanous Breathing and non-rebreather face mask  Post-op Assessment: Report given to RN and Post -op Vital signs reviewed and stable  Post vital signs: Reviewed and stable  Last Vitals:  Vitals:   12/09/17 1550 12/09/17 1555  BP: 131/69 (!) 149/82  Pulse:    Resp: 15 20  Temp:    SpO2: 97% 95%    Last Pain:  Vitals:   12/09/17 1419  TempSrc:   PainSc: 7          Complications: No apparent anesthesia complications

## 2017-12-09 NOTE — Interval H&P Note (Signed)
History and Physical Interval Note:  12/09/2017 3:57 PM  Russell Luna  has presented today for surgery, with the diagnosis of open right ankle bimalleolar fracture  The various methods of treatment have been discussed with the patient and family. After consideration of risks, benefits and other options for treatment, the patient has consented to  Procedure(s) with comments: OPEN REDUCTION INTERNAL FIXATION (ORIF) ANKLE FRACTURE (Right) - 4pm as a surgical intervention .  The patient's history has been reviewed, patient examined, no change in status, stable for surgery.  I have reviewed the patient's chart and labs.  Questions were answered to the patient's satisfaction.     Fuller CanadaStanley Harrison

## 2017-12-09 NOTE — Consult Note (Addendum)
Patient ID: Russell Luna, male   DOB: 09-May-1946, 71 y.o.   MRN: [id number]  Chief Complaint  Patient presents with  . Ankle Pain    HPI Russell Luna is a 71 y.o. male.  Came to ER with open fracture right ankle and multiple foot fractures     Review of Systems Review of Systems  Unable to perform ROS: Acuity of condition     Past Medical History:  Diagnosis Date  . Arthritis   . High cholesterol   . Hypertension     History reviewed. No pertinent surgical history.  No family history on file.  Social History Social History   Tobacco Use  . Smoking status: Smoker, Current Status Unknown  . Smokeless tobacco: Never Used  Substance Use Topics  . Alcohol use: No    Frequency: Never  . Drug use: No    No Known Allergies  Current Facility-Administered Medications  Medication Dose Route Frequency Provider Last Rate Last Dose  . 0.9 %  sodium chloride infusion   Intravenous Continuous Vanetta MuldersZackowski, Scott, MD 75 mL/hr at 12/09/17 1127    . etomidate (AMIDATE) injection 27.9 mg  0.3 mg/kg Intravenous Once Vanetta MuldersZackowski, Scott, MD       Current Outpatient Medications  Medication Sig Dispense Refill  . atorvastatin (LIPITOR) 80 MG tablet Take 40 mg by mouth daily.    . hydrochlorothiazide (HYDRODIURIL) 25 MG tablet Take 25 mg by mouth daily.    Marland Kitchen. lisinopril (PRINIVIL,ZESTRIL) 40 MG tablet Take 10 mg by mouth daily.    Marland Kitchen. morphine (MS CONTIN) 30 MG 12 hr tablet Take 30 mg by mouth every 12 (twelve) hours.    Marland Kitchen. morphine (MSIR) 15 MG tablet Take 15 mg by mouth daily.         Physical Exam BP 123/64   Pulse 84   Temp 97.7 F (36.5 C) (Oral)   Resp 12   Wt 205 lb (93 kg)   SpO2 95%  Physical Exam  Constitutional: He appears well-developed and well-nourished. He appears distressed.  HENT:  Head: Normocephalic and atraumatic.  Right Ear: External ear normal.  Left Ear: External ear normal.  Nose: Nose normal.  Mouth/Throat: Oropharynx is clear and moist.  Eyes:  Conjunctivae and EOM are normal. Pupils are equal, round, and reactive to light. Right eye exhibits no discharge. Left eye exhibits no discharge. No scleral icterus.  Neck: Neck supple. No JVD present. No tracheal deviation present. No thyromegaly present.  Cardiovascular: Normal rate, regular rhythm, normal heart sounds and intact distal pulses.  Pulmonary/Chest: Effort normal and breath sounds normal. No stridor.  Abdominal: Soft. Bowel sounds are normal. He exhibits no distension and no mass. There is no tenderness. There is no rebound and no guarding.  Musculoskeletal:       Right shoulder: Normal.       Left shoulder: Normal.       Right elbow: Normal.      Left elbow: Normal.       Right wrist: Normal.       Left wrist: Normal.       Right hip: Normal.       Left hip: Normal.       Right knee: Normal.       Left knee: Normal.       Right ankle: He exhibits decreased range of motion, swelling, ecchymosis, deformity and laceration. He exhibits normal pulse. Tenderness. Lateral malleolus, medial malleolus, AITFL and CF ligament tenderness found. No posterior  TFL and no proximal fibula tenderness found.       Left ankle: Normal.       Right upper arm: Normal.       Left upper arm: Normal.       Right forearm: Normal.       Left forearm: Normal.       Arms:      Right hand: Normal.       Left hand: Normal.       Left upper leg: Normal.       Left lower leg: Normal.       Right foot: There is decreased range of motion, tenderness, bony tenderness, swelling, crepitus, deformity and laceration. There is normal capillary refill.       Left foot: Normal.  Lymphadenopathy:    He has no cervical adenopathy.  Neurological: He is alert. No cranial nerve deficit. He exhibits normal muscle tone. Coordination normal.  Skin: Skin is warm and dry. He is not diaphoretic.  Psychiatric: He has a normal mood and affect. His behavior is normal. Judgment and thought content normal.   The patient is  well developed well nourished and well groomed.  Orientation to person place and time is normal  Mood is pleasant.  Ambulatory status not walking  Ortho Exam   Data Reviewed  @DX @  I reviewed the x-ray and independently interpreted as 3 views of the ankle and there is a  displaced bimalleolar right ankle fracture   Assessment    Ankle fracture:open right ankle fracture   Multiple fractures in the foot     Plan orif right ankle irrigation and debridement   Address foot intraop for I and D   The procedure has been fully reviewed with the patient; The risks and benefits of surgery have been discussed and explained and understood. Alternative treatment has also been reviewed, questions were encouraged and answered. The postoperative plan is also been reviewed.   Addendum  Upper right and left extremities shoulder elbow wrist arm and forearm wrist and hand no tenderness or deformity.  Normal range of motion.  No joint subluxation or dislocation.  Muscle tone normal.  1 skin abrasion right hand none on the left neurovascular exam normal color perfusion capillary refill and pulse normal sensation.  Internal and external stress test of the pelvis was normal.  Left lower extremity hip knee ankle leg and thigh normal range of motion stability strength alignment no tenderness no skin lacerations neurovascular exam intact with good color capillary refill and sensation  Right hip and knee normal range of motion strength stability and alignment no tenderness  Right ankle is splinted.  ER report indicates the medial laceration approximately 5 cm.  Open fracture noted by ER physician  Tetanus status not currently known we will check with the ER.  They gave 1 g of Ancef I gave an additional gram.

## 2017-12-09 NOTE — ED Notes (Signed)
Pt taken to ct 

## 2017-12-09 NOTE — H&P (View-Only) (Signed)
Patient ID: Russell Luna, male   DOB: [birth year], 71 y.o.   MRN: [id number]  Chief Complaint  Patient presents with  . Ankle Pain    HPI Russell Krausshomas K Duford is a 71 y.o. male.  Came to ER with open fracture right ankle and multiple foot fractures     Review of Systems Review of Systems  Unable to perform ROS: Acuity of condition     Past Medical History:  Diagnosis Date  . Arthritis   . High cholesterol   . Hypertension     History reviewed. No pertinent surgical history.  No family history on file.  Social History Social History   Tobacco Use  . Smoking status: Smoker, Current Status Unknown  . Smokeless tobacco: Never Used  Substance Use Topics  . Alcohol use: No    Frequency: Never  . Drug use: No    No Known Allergies  Current Facility-Administered Medications  Medication Dose Route Frequency Provider Last Rate Last Dose  . 0.9 %  sodium chloride infusion   Intravenous Continuous Vanetta MuldersZackowski, Scott, MD 75 mL/hr at 12/09/17 1127    . etomidate (AMIDATE) injection 27.9 mg  0.3 mg/kg Intravenous Once Vanetta MuldersZackowski, Scott, MD       Current Outpatient Medications  Medication Sig Dispense Refill  . atorvastatin (LIPITOR) 80 MG tablet Take 40 mg by mouth daily.    . hydrochlorothiazide (HYDRODIURIL) 25 MG tablet Take 25 mg by mouth daily.    Marland Kitchen. lisinopril (PRINIVIL,ZESTRIL) 40 MG tablet Take 10 mg by mouth daily.    Marland Kitchen. morphine (MS CONTIN) 30 MG 12 hr tablet Take 30 mg by mouth every 12 (twelve) hours.    Marland Kitchen. morphine (MSIR) 15 MG tablet Take 15 mg by mouth daily.         Physical Exam BP 123/64   Pulse 84   Temp 97.7 F (36.5 C) (Oral)   Resp 12   Wt 205 lb (93 kg)   SpO2 95%  Physical Exam  Constitutional: He appears well-developed and well-nourished. He appears distressed.  HENT:  Head: Normocephalic and atraumatic.  Right Ear: External ear normal.  Left Ear: External ear normal.  Nose: Nose normal.  Mouth/Throat: Oropharynx is clear and moist.  Eyes:  Conjunctivae and EOM are normal. Pupils are equal, round, and reactive to light. Right eye exhibits no discharge. Left eye exhibits no discharge. No scleral icterus.  Neck: Neck supple. No JVD present. No tracheal deviation present. No thyromegaly present.  Cardiovascular: Normal rate, regular rhythm, normal heart sounds and intact distal pulses.  Pulmonary/Chest: Effort normal and breath sounds normal. No stridor.  Abdominal: Soft. Bowel sounds are normal. He exhibits no distension and no mass. There is no tenderness. There is no rebound and no guarding.  Musculoskeletal:       Right shoulder: Normal.       Left shoulder: Normal.       Right elbow: Normal.      Left elbow: Normal.       Right wrist: Normal.       Left wrist: Normal.       Right hip: Normal.       Left hip: Normal.       Right knee: Normal.       Left knee: Normal.       Right ankle: He exhibits decreased range of motion, swelling, ecchymosis, deformity and laceration. He exhibits normal pulse. Tenderness. Lateral malleolus, medial malleolus, AITFL and CF ligament tenderness found. No posterior  TFL and no proximal fibula tenderness found.       Left ankle: Normal.       Right upper arm: Normal.       Left upper arm: Normal.       Right forearm: Normal.       Left forearm: Normal.       Arms:      Right hand: Normal.       Left hand: Normal.       Left upper leg: Normal.       Left lower leg: Normal.       Right foot: There is decreased range of motion, tenderness, bony tenderness, swelling, crepitus, deformity and laceration. There is normal capillary refill.       Left foot: Normal.  Lymphadenopathy:    He has no cervical adenopathy.  Neurological: He is alert. No cranial nerve deficit. He exhibits normal muscle tone. Coordination normal.  Skin: Skin is warm and dry. He is not diaphoretic.  Psychiatric: He has a normal mood and affect. His behavior is normal. Judgment and thought content normal.   The patient is  well developed well nourished and well groomed.  Orientation to person place and time is normal  Mood is pleasant.  Ambulatory status not walking  Ortho Exam   Data Reviewed  @DX @  I reviewed the x-ray and independently interpreted as 3 views of the ankle and there is a  displaced bimalleolar right ankle fracture   Assessment    Ankle fracture:open right ankle fracture   Multiple fractures in the foot     Plan orif right ankle irrigation and debridement   Address foot intraop for I and D   The procedure has been fully reviewed with the patient; The risks and benefits of surgery have been discussed and explained and understood. Alternative treatment has also been reviewed, questions were encouraged and answered. The postoperative plan is also been reviewed.

## 2017-12-09 NOTE — ED Notes (Signed)
ED Provider at bedside. 

## 2017-12-09 NOTE — Brief Op Note (Signed)
12/09/2017  6:58 PM  PATIENT:  Jacqlyn Krausshomas K Augsburger  71 y.o. male  PRE-OPERATIVE DIAGNOSIS:  open right ankle bimalleolar fracture  POST-OPERATIVE DIAGNOSIS:  open right ankle bimalleolar fracture  Multiple fractures of the foot including open fracture of the proximal phalanx of the third fourth and fifth  Second metatarsal fracture at the neck distal shaft and base closed  Open fourth metatarsal fracture distally  Navicular avulsion fracture cuboid and calcaneus fracture, calcaneus fracture not intra-articular  PROCEDURE:  Procedure(s) with comments: Irrigation and debridement and OPEN REDUCTION INTERNAL FIXATION (ORIF) ANKLE  IRRIGATION AND DEBRIDEMENT AND OPEN TREATMENT OF THIRD FOURTH AND FIFTH PROXIMAL PHALANX FRACTURES AND OPEN FOURTH METATARSAL FRACTURE  Right foot  Operative findings Displaced open medial malleolus fracture  Displaced open lateral malleolus fracture with severe comminution of the distal fibula  Rupture of the anterolateral ankle joint capsule and ligament between the anteroinferior tibia and fibula  Lacerations were as follows  6 cm laceration medially 14 cm laceration lateral foot 2 cm laceration lateral ankle at the fracture site of the lateral malleolus 3 cm laceration between third and fourth digit 4 cm laceration between fourth and fifth digit   Operative procedure was performed as follows Patient was evaluated in the preop area surgical site marked  Patient taken to surgery for spinal anesthesia  Tourniquet was applied to the right thigh splint was removed.  The ankle was prepped and draped sterilely with Betadine  Timeout was taken  The limb was exsanguinated with a 4 inch Esmarch tourniquet was elevated to 300 mmHg  The wounds of the foot were wrapped and kept out of the surgical field  A lateral incision was made anteriorly along the fibula and this was taken down to bone and retracted medially and laterally as needed to get  exposure  The fractured fibula was comminuted with avulsion of the anterior inferior tib-fib ligament  The fracture was irrigated and reduced and a lateral plate was applied taking C-arm shots prior to inserting any screws.  Radiographs were taken after the plate was placed using AO technique starting with nonlocking screws and using some distal locking screws where the bone was comminuted.  I then open the medial side extending the incision and irrigated the wound again irrigated the joint reduced the medial malleolus confirm reduction with x-ray and K wires  I then overdrilled the K wires up placing 2 4.0 cannulated screws  Radiographs and stress testing was performed.  There was subluxation of the talus  I then went back to the lateral side and noted that the fracture was not stable.  I therefore remove the plate reproduce the fracture took a C-arm x-ray confirmed reduction and reapplied the plate using nonlocking and locking screws.  I then took 2 #2 Ethibond sutures and repaired the anterior and anterolateral ankle joint capsule including the anterior inferior tib-fib ligament  After stress testing and further x-rays this gave a stable ankle joint.  (The reason for the subluxation was at the distal fibula was not included in the initial plating secondary to it being mistaken for posterior comminution)  I then covered those wounds and then address the foot.  We did copious amounts of irrigation there and closed those wounds loosely with 2-0 Monocryl and staples  I then reirrigated the lateral and medial wound and closed those wounds with 0 Monocryl laterally 2-0 Monocryl medially and staples  Portions of the medial wound, lateral foot wound interdigital wounds and lateral wound was left open  I did inject some Marcaine into the foot.  I did not inject any medially I injected some laterally  Sterile dressings and posterior and sugar tong splint were applied after final radiographs  were obtained confirming reduction of them ankle mortise and ankle joint.  The patient was taken to recovery room in stable condition  In the recovery room I checked his foot and his third fourth and fifth digit did show some discoloration but capillary refill although sluggish was present and I do expect this to come back to normal with the exception of the small toe which may or may not survive and the family has been made aware    SURGEON:  Surgeon(s) and Role:    * Vickki HearingHarrison, Stanley E, MD - Primary  PHYSICIAN ASSISTANT:   ASSISTANTS: none   ANESTHESIA:   spinal  EBL:  20 mL   BLOOD ADMINISTERED:none  DRAINS: none   LOCAL MEDICATIONS USED:  MARCAINE     SPECIMEN:  No Specimen  DISPOSITION OF SPECIMEN:  N/A  COUNTS:  YES  TOURNIQUET:   Total Tourniquet Time Documented: Thigh (Right) - 113 minutes Total: Thigh (Right) - 113 minutes   DICTATION: .Reubin Milanragon Dictation  PLAN OF CARE: Admit to inpatient   PATIENT DISPOSITION:  PACU - hemodynamically stable.   Delay start of Pharmacological VTE agent (>24hrs) due to surgical blood loss or risk of bleeding: not applicable

## 2017-12-09 NOTE — Progress Notes (Signed)
This RN spoke with Dr. Romeo AppleHarrison about pts concern for PRN pain medication, BP of 85/33, cool to touch toes, blood drainage and Lovenox administration. Dr. Romeo AppleHarrison stated he was not going to order any PRN medication d/t pt already being on MS Contin 30mg  scheduled (long acting), He adv me not give any BP medication is ordered d/t his low BP and continue to monitor (no parameters given), Also he adv me to hold off on Lovenox until the am. Pharmacy called and made aware of Lovenox dosage.  [facility] called and made aware that pt has order for foot pumps, she stated she would bring them if they had them.

## 2017-12-09 NOTE — Anesthesia Procedure Notes (Signed)
Procedure Name: MAC Date/Time: 12/09/2017 3:59 PM Performed by: Vista Deck, CRNA Pre-anesthesia Checklist: Patient identified, Emergency Drugs available, Suction available, Timeout performed and Patient being monitored Patient Re-evaluated:Patient Re-evaluated prior to induction Oxygen Delivery Method: Non-rebreather mask

## 2017-12-10 ENCOUNTER — Encounter (HOSPITAL_COMMUNITY): Payer: Self-pay | Admitting: Orthopedic Surgery

## 2017-12-10 MED ORDER — CEFAZOLIN SODIUM-DEXTROSE 2-4 GM/100ML-% IV SOLN
2.0000 g | Freq: Three times a day (TID) | INTRAVENOUS | Status: DC
  Administered 2017-12-10 – 2017-12-13 (×9): 2 g via INTRAVENOUS
  Filled 2017-12-10 (×19): qty 100

## 2017-12-10 MED ORDER — MORPHINE SULFATE 15 MG PO TABS
15.0000 mg | ORAL_TABLET | ORAL | Status: DC
  Administered 2017-12-10 – 2017-12-13 (×18): 15 mg via ORAL
  Filled 2017-12-10 (×19): qty 1

## 2017-12-10 NOTE — Progress Notes (Signed)
Patient ID: Russell Luna, male   DOB: 05/01/1946, 71 y.o.   MRN: [id number]   BP (!) 98/55 (BP Location: Left Arm)   Pulse 96   Temp 99 F (37.2 C) (Oral)   Resp 20   Ht 5\' 11"  (1.803 m)   Wt 212 lb 1.3 oz (96.2 kg)   SpO2 90%   BMI 29.58 kg/m   Postop day 1 status post open bimalleolar ankle fracture and multiple fractures of the foot  Pain was controlled with  Current Facility-Administered Medications:  .  0.9 %  sodium chloride infusion, , Intravenous, Continuous, Vickki HearingHarrison, Stanley E, MD, Last Rate: 75 mL/hr at 12/09/17 2128 .  acetaminophen (TYLENOL) tablet 500 mg, 500 mg, Oral, Q4H, Vickki HearingHarrison, Stanley E, MD, 500 mg at 12/10/17 0641 .  atorvastatin (LIPITOR) tablet 40 mg, 40 mg, Oral, q1800, Vickki HearingHarrison, Stanley E, MD, 40 mg at 12/09/17 2122 .  bisacodyl (DULCOLAX) EC tablet 5 mg, 5 mg, Oral, Daily PRN, Vickki HearingHarrison, Stanley E, MD .  docusate sodium (COLACE) capsule 100 mg, 100 mg, Oral, BID, Vickki HearingHarrison, Stanley E, MD .  enoxaparin (LOVENOX) injection 30 mg, 30 mg, Subcutaneous, Q24H, Vickki HearingHarrison, Stanley E, MD .  fentaNYL (SUBLIMAZE) injection 12.5 mcg, 12.5 mcg, Intravenous, Q2H PRN, Vickki HearingHarrison, Stanley E, MD, 12.5 mcg at 12/10/17 0529 .  gabapentin (NEURONTIN) capsule 200 mg, 200 mg, Oral, TID, Vickki HearingHarrison, Stanley E, MD, 200 mg at 12/09/17 2232 .  hydrochlorothiazide (HYDRODIURIL) tablet 25 mg, 25 mg, Oral, Daily, Vickki HearingHarrison, Stanley E, MD .  ibuprofen (ADVIL,MOTRIN) tablet 400 mg, 400 mg, Oral, Q6H, Vickki HearingHarrison, Stanley E, MD, 400 mg at 12/10/17 0531 .  lisinopril (PRINIVIL,ZESTRIL) tablet 10 mg, 10 mg, Oral, Daily, Vickki HearingHarrison, Stanley E, MD .  morphine (MS CONTIN) 12 hr tablet 30 mg, 30 mg, Oral, Q12H, Vickki HearingHarrison, Stanley E, MD, 30 mg at 12/09/17 2121 .  morphine (MSIR) tablet 15 mg, 15 mg, Oral, Q4H PRN, Vickki HearingHarrison, Stanley E, MD .  naloxone [facility](NARCAN) injection 0.4 mg, 0.4 mg, Intravenous, PRN, Vickki HearingHarrison, Stanley E, MD .  nicotine (NICODERM CQ - dosed in mg/24 hours) patch 14 mg, 14 mg, Transdermal,  Daily, Vickki HearingHarrison, Stanley E, MD, 14 mg at 12/09/17 2123 .  nicotine polacrilex (NICORETTE) gum 2 mg, 2 mg, Oral, PRN, Vickki HearingHarrison, Stanley E, MD .  promethazine (PHENERGAN) tablet 12.5 mg, 12.5 mg, Oral, Q6H PRN, Vickki HearingHarrison, Stanley E, MD .  senna-docusate (Senokot-S) tablet 1 tablet, 1 tablet, Oral, QHS PRN, Vickki HearingHarrison, Stanley E, MD .  sodium chloride 0.9 % bolus 500 mL, 500 mL, Intravenous, Q4H PRN, Vickki HearingHarrison, Stanley E, MD  Decreased sensation in the lesser digits #5 and #4.  Color of #3 and 4 have returned to normal with good capillary refill #5 still sluggish  Patient will be mobilized with physical therapy nonweightbearing today and then tomorrow dressing changes will be performed to determine if more surgery is needed in the immediate postop period

## 2017-12-10 NOTE — Evaluation (Signed)
Physical Therapy Evaluation Patient Details Name: Russell Krausshomas K Mussell MRN: [id number] DOB: [birth year] Today's Date: 12/10/2017   History of Present Illness    Russell Luna is a 71 y.o. male, s/p ORIF right ankle and foot 12/09/2017. Came to ER with open fracture right ankle and multiple foot fractures     Clinical Impression  Patient presents with deficits in weight bearing status, mobility, ambulation, strength and transfer ability due to s/p ORIP right ankle and foot 12/10/2017. Patient overall performed well with bed mobility, transfers, stand pivot transfers and short distance ambulation with 2 wheeled walker and assistance to adhere to weight bearing restrictions of WBAT.  Patient will benefit from continued therapy while in hospital and venue recommended below to increase strength, balance and safety for functional ADL's and gait.    Follow Up Recommendations SNF;CIR;Supervision for mobility/OOB    Equipment Recommendations  Rolling walker with 5" wheels    Recommendations for Other Services       Precautions / Restrictions        Mobility  Bed Mobility Overal bed mobility: Needs Assistance(for RLE and safety) Bed Mobility: Rolling;Supine to Sit;Sit to Supine Rolling: Min assist(for RLE and safety) Sidelying to sit: Min assist Supine to sit: Min assist(for RLE and safety) Sit to supine: Min assist(for RLE and safety)      Transfers Overall transfer level: Needs assistance(for RLE and safety) Equipment used: Rolling walker (2 wheeled) Transfers: Stand Pivot Transfers   Stand pivot transfers: Min assist(for RLE and safety)          Ambulation/Gait Ambulation/Gait assistance: Min assist Ambulation Distance (Feet): 10 Feet Assistive device: Rolling walker (2 wheeled)       General Gait Details: NWB on RLE with assistance to hold and protect RLE; slow, short step-to hops on LLE; good upper extremity strength for walker use  Stairs            Wheelchair  Mobility    Modified Rankin (Stroke Patients Only) Modified Rankin (Stroke Patients Only) Modified Rankin: Moderately severe disability     Balance Overall balance assessment: Needs assistance                                           Pertinent Vitals/Pain Pain Assessment: 0-10 Pain Score: 6  Pain Descriptors / Indicators: Aching;Dull;Pounding Pain Intervention(s): Premedicated before session    Home Living Family/patient expects to be discharged to:: Private residence Living Arrangements: Spouse/significant other Available Help at Discharge: Family Type of Home: House Home Access: Stairs to enter   Secretary/administratorntrance Stairs-Number of Steps: 3 steps to porch with handrail; small step through door no handrail Home Layout: Able to live on main level with bedroom/bathroom Home Equipment: Cane - quad      Prior Function Level of Independence: Independent               Hand Dominance        Extremity/Trunk Assessment   Upper Extremity Assessment Upper Extremity Assessment: Overall WFL for tasks assessed(patient does have a laceration at base of right hand)    Lower Extremity Assessment Lower Extremity Assessment: RLE deficits/detail(WBAT on RLE s/p ORIF multiple ankle and foot fractures) RLE: Unable to fully assess due to immobilization       Communication   Communication: HOH(hearing aids left and right)  Cognition Arousal/Alertness: Awake/alert Behavior During Therapy: WFL for tasks assessed/performed Overall Cognitive Status:  Within Functional Limits for tasks assessed                                        General Comments      Exercises Total Joint Exercises Ankle Circles/Pumps: AROM;Supine;Strengthening;Left;10 reps Quad Sets: AROM;Supine;Strengthening;Both;10 reps Straight Leg Raises: AROM;Supine;Strengthening;Both;10 reps   Assessment/Plan    PT Assessment Patient needs continued PT services  PT Problem List  Decreased strength;Decreased range of motion;Decreased activity tolerance;Decreased balance;Decreased mobility;Pain;Decreased skin integrity       PT Treatment Interventions Gait training;Functional mobility training;Patient/family education;Therapeutic activities;Therapeutic exercise;Balance training;Other (comment)(stair training if discussion of return to home environment.)    PT Goals (Current goals can be found in the Care Plan section)  Acute Rehab PT Goals Patient Stated Goal: Return home PT Goal Formulation: With patient/family Time For Goal Achievement: 12/24/17 Potential to Achieve Goals: Good    Frequency 7X/week   Barriers to discharge Inaccessible home environment Patient did stand from toilet with 2WW without asking for assistance.    Co-evaluation               AM-PAC PT "6 Clicks" Daily Activity  Outcome Measure Difficulty turning over in bed (including adjusting bedclothes, sheets and blankets)?: A Little Difficulty moving from lying on back to sitting on the side of the bed? : A Little Difficulty sitting down on and standing up from a chair with arms (e.g., wheelchair, bedside commode, etc,.)?: A Little Help needed moving to and from a bed to chair (including a wheelchair)?: A Little Help needed walking in hospital room?: A Little Help needed climbing 3-5 steps with a railing? : A Lot 6 Click Score: 17    End of Session Equipment Utilized During Treatment: Other (comment)(2WW) Activity Tolerance: Patient tolerated treatment well(without an increase in RLE pain during stand pivot transfer; after sitting in chair for 45 minutes pain faces scale 9/10; nursing dosed additional pain medicine) Patient left: Other (comment)(initially patient left in chair with family member present and call button w/in reach; PT returned to assist in ambulation to toilet and return to bed with family member present and nursing present) Nurse Communication: Mobility status;Weight  bearing status;Patient requests pain meds PT Visit Diagnosis: Unsteadiness on feet (R26.81);Other abnormalities of gait and mobility (R26.89);Muscle weakness (generalized) (M62.81)    Time: [id number] PT Time Calculation (min) (ACUTE ONLY): 37 min   Charges:   PT Evaluation $PT Eval Moderate Complexity: 1 Mod PT Treatments $Gait Training: 8-22 mins $Therapeutic Exercise: 8-22 mins   PT G Codes:   PT G-Codes **NOT FOR INPATIENT CLASS** Functional Assessment Tool Used: AM-PAC 6 Clicks Basic Mobility Functional Limitation: Mobility: Walking and moving around Mobility: Walking and Moving Around Current Status ([id number](G8978): At least 40 percent but less than 60 percent impaired, limited or restricted Mobility: Walking and Moving Around Goal Status [phone](G8979): At least 40 percent but less than 60 percent impaired, limited or restricted Mobility: Walking and Moving Around Discharge Status [phone](G8980): At least 40 percent but less than 60 percent impaired, limited or restricted    Katina DungBarbara D. Hartnett-Rands, MS, PT Per Diem [facility] [phone]

## 2017-12-10 NOTE — Progress Notes (Signed)
Pt in severe pain. 10/10 scale.   Pt's BP is 112/32.  Paged Dr Romeo AppleHarrison who states to give pt half dose which is 0.14025ml  or 6.7225mcg.   Pt also given 500ml bolus of normal saline per order from Dr Romeo AppleHarrison.     Will continue to monitor BP.

## 2017-12-10 NOTE — Plan of Care (Signed)
  Progressing Acute Rehab PT Goals(only PT should resolve) Pt Will Go Sit To Supine/Side 12/10/2017 1342 - Progressing by Hartnett-Rands, Britta MccreedyBarbara, PT Flowsheets Taken 12/10/2017 1342  Pt will go Sit to Supine/Side Independently Patient Will Transfer Sit To/From Stand 12/10/2017 1342 - Progressing by Epifanio LeschesHartnett-Rands, Eriel Doyon, PT Flowsheets Taken 12/10/2017 1342  Patient will transfer sit to/from stand with supervision Pt Will Transfer Bed To Chair/Chair To Bed 12/10/2017 1342 - Progressing by Hartnett-Rands, Britta MccreedyBarbara, PT Flowsheets Taken 12/10/2017 1342  Pt will Transfer Bed to Chair/Chair to Bed with supervision Pt Will Ambulate 12/10/2017 1342 - Progressing by Hartnett-Rands, Britta MccreedyBarbara, PT Flowsheets Taken 12/10/2017 1342  Pt will Ambulate 15 feet;with supervision;with rolling walker Pt Will Verbalize and Adhere to Precautions While Description PT Will Verbalize and Adhere to Precautions While Performing Mobility 12/10/2017 1342 - Progressing by Hartnett-Rands, Britta MccreedyBarbara, PT Flowsheets Taken 12/10/2017 1342  Pt will verbalize and adhere to precautions while performing mobility -- (WBAT)   Katina DungBarbara D. Hartnett-Rands, MS, PT Per Diem PT [facility] [phone]

## 2017-12-10 NOTE — Anesthesia Postprocedure Evaluation (Signed)
Anesthesia Post Note  Patient: Russell Luna  Procedure(s) Performed: OPEN REDUCTION INTERNAL FIXATION (ORIF) ANKLE FRACTURE (Right Ankle)  Patient location during evaluation: Nursing Unit Anesthesia Type: Spinal Level of consciousness: awake and alert, oriented and patient cooperative Pain management: pain level controlled Vital Signs Assessment: post-procedure vital signs reviewed and stable Respiratory status: spontaneous breathing Cardiovascular status: stable Postop Assessment: no apparent nausea or vomiting Anesthetic complications: no     Last Vitals:  Vitals:   12/10/17 0520 12/10/17 0827  BP: (!) 117/54 (!) 98/55  Pulse: 85 96  Resp: 20 20  Temp: 36.7 C 37.2 C  SpO2: 100% 90%    Last Pain:  Vitals:   12/10/17 0827  TempSrc: Oral  PainSc:                  [name]

## 2017-12-10 NOTE — Progress Notes (Signed)
Rehab Admissions Coordinator Note:  Patient was screened by [name], [name] for appropriateness for an Inpatient Acute Rehab Consult per PT recommendations for SNF or CIR. At this time, we [facility] Patient's diagnosis lacks the medial neccesity for an inpatient acute hospital rehab. Please call me with any questions.   [name], [name] 12/10/2017, 2:32 PM  I can be reached at [phone].

## 2017-12-10 NOTE — Addendum Note (Signed)
Addendum  created 12/10/17 1022 by Earleen NewportAdams, Shelise Maron A, CRNA   Sign clinical note

## 2017-12-11 LAB — CBC WITH DIFFERENTIAL/PLATELET
Basophils Absolute: 0 10*3/uL (ref 0.0–0.1)
Basophils Relative: 0 %
EOS PCT: 1 %
Eosinophils Absolute: 0.2 10*3/uL (ref 0.0–0.7)
HCT: 35.5 % — ABNORMAL LOW (ref 39.0–52.0)
Hemoglobin: 11.2 g/dL — ABNORMAL LOW (ref 13.0–17.0)
LYMPHS PCT: 12 %
Lymphs Abs: 1.8 10*3/uL (ref 0.7–4.0)
MCH: 31.3 pg (ref 26.0–34.0)
MCHC: 31.5 g/dL (ref 30.0–36.0)
MCV: 99.2 fL (ref 78.0–100.0)
MONO ABS: 1.9 10*3/uL — AB (ref 0.1–1.0)
Monocytes Relative: 12 %
Neutro Abs: 11.5 10*3/uL — ABNORMAL HIGH (ref 1.7–7.7)
Neutrophils Relative %: 75 %
PLATELETS: 198 10*3/uL (ref 150–400)
RBC: 3.58 MIL/uL — ABNORMAL LOW (ref 4.22–5.81)
RDW: 13.5 % (ref 11.5–15.5)
WBC: 15.4 10*3/uL — ABNORMAL HIGH (ref 4.0–10.5)

## 2017-12-11 LAB — BASIC METABOLIC PANEL
Anion gap: 9 (ref 5–15)
BUN: 12 mg/dL (ref 6–20)
CALCIUM: 8.9 mg/dL (ref 8.9–10.3)
CO2: 27 mmol/L (ref 22–32)
Chloride: 105 mmol/L (ref 101–111)
Creatinine, Ser: 1.07 mg/dL (ref 0.61–1.24)
GFR calc Af Amer: >60 mL/min (ref >60)
GLUCOSE: 113 mg/dL — AB (ref 65–99)
Potassium: 4.4 mmol/L (ref 3.5–5.1)
Sodium: 141 mmol/L (ref 135–145)

## 2017-12-11 NOTE — Progress Notes (Signed)
Pt instructed on Incentive spirometry. See flowsheet

## 2017-12-11 NOTE — Evaluation (Signed)
Occupational Therapy Evaluation Patient Details Name: Russell Luna K Crossen MRN: [id number] DOB: 10/12/1946 Today's Date: 12/11/2017    History of Present Illness Russell Luna K Berghuis  has presented today for surgery, with the diagnosis of open right ankle bimalleolar fracture. Pt is s/p ORIF   Clinical Impression   Pt received semi-reclined in bed, agreeable to OT evaluation. Pt requiring increased level of assistance for ADL completion due to pain and NWB status for RLE. Pt did well with maintaining NWB during evaluation. Pt's wife will be available to assist with ADLs on discharge home, no further OT services required at this time.     Follow Up Recommendations  No OT follow up;Supervision/Assistance - 24 hour    Equipment Recommendations  None recommended by OT       Precautions / Restrictions Precautions Precautions: Fall Restrictions Weight Bearing Restrictions: Yes RLE Weight Bearing: Non weight bearing      Mobility Bed Mobility Overal bed mobility: Needs Assistance Bed Mobility: Supine to Sit     Supine to sit: Supervision        Transfers Overall transfer level: Needs assistance Equipment used: Rolling walker (2 wheeled) Transfers: Sit to/from BJ'sStand;Stand Pivot Transfers Sit to Stand: Min guard;From elevated surface Stand pivot transfers: Min guard                ADL either performed or assessed with clinical judgement   ADL Overall ADL's : Needs assistance/impaired                                       General ADL Comments: Pt requiring increased assistance due to limitations from RLE fracture and NWB status. pt is able to complete tasks with set-up in sitting, increased assistance for LB ADL tasks.      Vision Baseline Vision/History: No visual deficits Patient Visual Report: No change from baseline Vision Assessment?: No apparent visual deficits            Pertinent Vitals/Pain Pain Assessment: 0-10 Pain Score: 6  Pain Location:  right ankle Pain Descriptors / Indicators: Aching;Dull;Pounding Pain Intervention(s): Limited activity within patient's tolerance;Monitored during session;Repositioned     Hand Dominance Right   Extremity/Trunk Assessment Upper Extremity Assessment Upper Extremity Assessment: Overall WFL for tasks assessed   Lower Extremity Assessment Lower Extremity Assessment: Defer to PT evaluation   Cervical / Trunk Assessment Cervical / Trunk Assessment: Normal   Communication Communication Communication: HOH(hearing aids)   Cognition Arousal/Alertness: Awake/alert Behavior During Therapy: WFL for tasks assessed/performed Overall Cognitive Status: Within Functional Limits for tasks assessed                                                Home Living Family/patient expects to be discharged to:: Private residence Living Arrangements: Spouse/significant other Available Help at Discharge: Family;Available 24 hours/day Type of Home: House Home Access: Stairs to enter Entergy CorporationEntrance Stairs-Number of Steps: 3 steps to porch with handrail; small step through door no handrail Entrance Stairs-Rails: Right;Left;Can reach both Home Layout: Able to live on main level with bedroom/bathroom     Bathroom Shower/Tub: Chief Strategy OfficerTub/shower unit   Bathroom Toilet: Standard     Home Equipment: Cane - quad          Prior Functioning/Environment Level of Independence: Independent  OT Problem List: Decreased activity tolerance;Impaired balance (sitting and/or standing);Pain       End of Session Equipment Utilized During Treatment: Gait belt;Rolling walker  Activity Tolerance: Patient tolerated treatment well Patient left: in chair;with call bell/phone within reach;with nursing/sitter in room  OT Visit Diagnosis: Muscle weakness (generalized) (M62.81);Pain Pain - Right/Left: Right Pain - part of body: Ankle and joints of foot                Time: 0802-0822 OT Time  Calculation (min): 20 min Charges:  OT General Charges $OT Visit: 1 Visit OT Evaluation $OT Eval Low Complexity: 1 Low    Ezra SitesLeslie Troxler, OTR/L  [phone] 12/11/2017, 8:33 AM

## 2017-12-11 NOTE — H&P (View-Only) (Signed)
Patient ID: Russell Luna, male   DOB: 12/02/1946, 71 y.o.   MRN: [id number]  Postoperative day 2 status post open treatment internal fixation open type II bimalleolar ankle fracture  With fracture of the third fourth and fifth proximal phalanx right foot  With second metatarsal fracture but  BP (!) 107/57 (BP Location: Left Arm)   Pulse 100   Temp (!) 100.9 F (38.3 C) (Oral)   Resp 18   Ht 5' 11" (1.803 m)   Wt 212 lb 1.3 oz (96.2 kg)   SpO2 97%   BMI 29.58 kg/m   t max 100.9  Pain is under good control with current medications which include multimodal pain control protocol  Images were taken to document the status of the wounds  No signs of infection were noted today.  The fifth digit color pink refill looks good although he has sensory loss on the plantar aspect  Fourth digit color is good capillary refill is good partial sensory loss on the dorsum of the toe.  Third toe is normal  Dressings were changed  The patient will be scheduled for a cast application and dressing change for tomorrow  Weightbearing status right foot no weightbearing on right foot     

## 2017-12-11 NOTE — Plan of Care (Signed)
Pt monitoring ongoing w/ neurological and vascular assessments-progressing accordingly.AW

## 2017-12-11 NOTE — Addendum Note (Signed)
Addendum  created 12/11/17 1426 by Franco NonesYates, Lochlann Mastrangelo S, CRNA   Sign clinical note

## 2017-12-11 NOTE — Clinical Social Work Note (Signed)
Patient plans on going home at Discharge.  LCSW signing off.   Malaki Koury, Juleen ChinaHeather D, LCSW

## 2017-12-11 NOTE — Progress Notes (Signed)
Physical Therapy Treatment Patient Details Name: Russell Luna MRN: [id number] DOB: [birth year] Today's Date: 12/11/2017    History of Present Illness Postoperative day 2 status post open treatment internal fixation open type II bimalleolar ankle fracture    PT Comments    Patient tolerated treatment well today with functional improvements noted since initial evaluation yesterday. Patient was able to perform longer ambulation distances (50 feet) adhering to NWB on RLE. Sit to stand transfers from low hospital bed are challenging due to left knee osteoarthritis and decreased flexion at the knee. Patient required multiple attempts and completed sit to stand transfer with modified independence. PLAN: Patient continues to benefit from physical therapy in current hospital and venue recommended below.    Follow Up Recommendations  SNF;Supervision for mobility/OOB     Equipment Recommendations  Rolling walker with 5" wheels    Recommendations for Other Services       Precautions / Restrictions Restrictions Weight Bearing Restrictions: Yes RLE Weight Bearing: Non weight bearing    Mobility  Bed Mobility Overal bed mobility: Needs Assistance Bed Mobility: Supine to Sit;Sit to Supine Rolling: Modified independent (Device/Increase time)   Supine to sit: Supervision Sit to supine: Min assist      Transfers Overall transfer level: Needs assistance Equipment used: Rolling walker (2 wheeled) Transfers: Sit to/from Stand Sit to Stand: Min guard;From elevated surface Stand pivot transfers: Min guard       General transfer comment: required extra time to sit to stand and min assist to maintain NWB on RLE  Ambulation/Gait Ambulation/Gait assistance: Min guard Ambulation Distance (Feet): 50 Feet Assistive device: Rolling walker (2 wheeled)       General Gait Details: NWB RLE, longer steps with LLE today and good pressure through BLEs on rolling walker   Stairs             Wheelchair Mobility    Modified Rankin (Stroke Patients Only)       Balance Overall balance assessment: Needs assistance Sitting-balance support: Bilateral upper extremity supported Sitting balance-Leahy Scale: Good     Standing balance support: Bilateral upper extremity supported                                Cognition Arousal/Alertness: Awake/alert Behavior During Therapy: WFL for tasks assessed/performed Overall Cognitive Status: Within Functional Limits for tasks assessed                                        Exercises Total Joint Exercises Ankle Circles/Pumps: AROM;Seated;Left;10 reps Quad Sets: AROM;Supine;Strengthening;Both;10 reps Straight Leg Raises: AROM;Supine;Strengthening;Both;10 reps Long Arc Quad: AROM;Seated;Both;10 Ecologistreps Marching in Standing: AROM;Seated;Both;10 reps    General Comments        Pertinent Vitals/Pain Pain Assessment: Faces Faces Pain Scale: Hurts a little bit Pain Location: right ankle Pain Descriptors / Indicators: Aching;Dull;Pounding Pain Intervention(s): Premedicated before session    Home Living                      Prior Function            PT Goals (current goals can now be found in the care plan section) Acute Rehab PT Goals Patient Stated Goal: Return home PT Goal Formulation: With patient Time For Goal Achievement: 12/15/17 Potential to Achieve Goals: Fair Progress towards PT goals: Progressing toward goals  Frequency    7X/week      PT Plan Current plan remains appropriate    Co-evaluation              AM-PAC PT "6 Clicks" Daily Activity  Outcome Measure  Difficulty turning over in bed (including adjusting bedclothes, sheets and blankets)?: A Little Difficulty moving from lying on back to sitting on the side of the bed? : A Little Difficulty sitting down on and standing up from a chair with arms (e.g., wheelchair, bedside commode, etc,.)?: A  Little Help needed moving to and from a bed to chair (including a wheelchair)?: A Little Help needed walking in hospital room?: A Little Help needed climbing 3-5 steps with a railing? : A Lot 6 Click Score: 17    End of Session Equipment Utilized During Treatment: Other (comment) Activity Tolerance: Patient tolerated treatment well Patient left: in bed;with call bell/phone within reach Nurse Communication: Mobility status PT Visit Diagnosis: Unsteadiness on feet (R26.81);Other abnormalities of gait and mobility (R26.89);Muscle weakness (generalized) (M62.81)     Time: [id number] PT Time Calculation (min) (ACUTE ONLY): 31 min  Charges:  $Gait Training: 8-22 mins $Therapeutic Exercise: 8-22 mins                    G Codes:  Functional Assessment Tool Used: AM-PAC 6 Clicks Basic Mobility Functional Limitation: Mobility: Walking and moving around;Carrying, moving and handling objects Mobility: Walking and Moving Around Current Status ([phone](G8978): At least 40 percent but less than 60 percent impaired, limited or restricted Mobility: Walking and Moving Around Goal Status [phone](G8979): At least 40 percent but less than 60 percent impaired, limited or restricted Mobility: Walking and Moving Around Discharge Status [phone](G8980): At least 40 percent but less than 60 percent impaired, limited or restricted Carrying, Moving and Handling Objects Current Status [phone](G8984): At least 40 percent but less than 60 percent impaired, limited or restricted Carrying, Moving and Handling Objects Goal Status [phone]([phone]): At least 40 percent but less than 60 percent impaired, limited or restricted Carrying, Moving and Handling Objects Discharge Status [phone]([phone]): At least 40 percent but less than 60 percent impaired, limited or restricted    Katina DungBarbara D. Hartnett-Rands, MS, PT Per Diem PT [facility] [phone]

## 2017-12-11 NOTE — Anesthesia Postprocedure Evaluation (Signed)
Anesthesia Post Note  Patient: Russell Luna  Procedure(s) Performed: OPEN REDUCTION INTERNAL FIXATION (ORIF) ANKLE FRACTURE (Right Ankle)  Patient location during evaluation: Nursing Unit Anesthesia Type: Spinal Level of consciousness: awake and alert Pain management: satisfactory to patient Vital Signs Assessment: post-procedure vital signs reviewed and stable Respiratory status: spontaneous breathing Cardiovascular status: stable Postop Assessment: no apparent nausea or vomiting and patient able to bend at knees Anesthetic complications: no     Last Vitals:  Vitals:   12/11/17 0431 12/11/17 0822  BP: (!) 107/57 (!) 135/59  Pulse: 100 91  Resp: 18 20  Temp: (!) 38.3 C 36.9 C  SpO2: 97% 99%    Last Pain:  Vitals:   12/11/17 1116  TempSrc:   PainSc: 5                  Kaytee Taliercio

## 2017-12-11 NOTE — Progress Notes (Signed)
Patient ID: Russell Luna Theard, male   DOB: [birth year], 71 y.o.   MRN: [id number]  Postoperative day 2 status post open treatment internal fixation open type II bimalleolar ankle fracture  With fracture of the third fourth and fifth proximal phalanx right foot  With second metatarsal fracture but  BP (!) 107/57 (BP Location: Left Arm)   Pulse 100   Temp (!) 100.9 F (38.3 C) (Oral)   Resp 18   Ht 5\' 11"  (1.803 m)   Wt 212 lb 1.3 oz (96.2 kg)   SpO2 97%   BMI 29.58 kg/m   t max 100.9  Pain is under good control with current medications which include multimodal pain control protocol  Images were taken to document the status of the wounds  No signs of infection were noted today.  The fifth digit color pink refill looks good although he has sensory loss on the plantar aspect  Fourth digit color is good capillary refill is good partial sensory loss on the dorsum of the toe.  Third toe is normal  Dressings were changed  The patient will be scheduled for a cast application and dressing change for tomorrow  Weightbearing status right foot no weightbearing on right foot

## 2017-12-12 ENCOUNTER — Inpatient Hospital Stay (HOSPITAL_COMMUNITY): Payer: Medicare Other

## 2017-12-12 ENCOUNTER — Encounter (HOSPITAL_COMMUNITY)
Admission: EM | Disposition: A | Discharge status: Home Health | Payer: Self-pay | Source: Home / Self Care | Attending: Orthopedic Surgery

## 2017-12-12 HISTORY — PX: CAST APPLICATION: SHX380

## 2017-12-12 SURGERY — APPLICATION, CAST
Anesthesia: LOCAL | Laterality: Right

## 2017-12-12 MED ORDER — STERILE WATER FOR IRRIGATION IR SOLN
Status: DC | PRN
  Administered 2017-12-12: 2000 mL

## 2017-12-12 SURGICAL SUPPLY — 15 items
BANDAGE ACE 4X5 VEL STRL LF (GAUZE/BANDAGES/DRESSINGS) ×2 IMPLANT
BANDAGE ACE 6X5 VEL STRL LF (GAUZE/BANDAGES/DRESSINGS) ×4 IMPLANT
CLOTH BEACON ORANGE TIMEOUT ST (SAFETY) ×2 IMPLANT
GLOVE BIOGEL PI IND STRL 7.0 (GLOVE) ×1 IMPLANT
GLOVE BIOGEL PI INDICATOR 7.0 (GLOVE) ×1
KIT ROOM TURNOVER APOR (KITS) ×2 IMPLANT
PAD ABD 5X9 TENDERSORB (GAUZE/BANDAGES/DRESSINGS) ×4 IMPLANT
PAD CAST 4YDX4 CTTN HI CHSV (CAST SUPPLIES) ×3 IMPLANT
PADDING CAST COTTON 4X4 STRL (CAST SUPPLIES) ×3
SPLINT J IMMOBILIZER 4X20FT (CAST SUPPLIES) ×1 IMPLANT
SPLINT J PLASTER J 4INX20Y (CAST SUPPLIES) ×1
STOCKINETTE TUBE COTTON 3IN (GAUZE/BANDAGES/DRESSINGS) ×1 IMPLANT
STOCKINETTE TUBULAR 6 INCH (GAUZE/BANDAGES/DRESSINGS) ×2 IMPLANT
STOCKINETTE TUBULAR COTTON 3IN (GAUZE/BANDAGES/DRESSINGS) ×1
WATER STERILE IRR 1000ML POUR (IV SOLUTION) ×4 IMPLANT

## 2017-12-12 NOTE — Care Management (Signed)
Patient Information   Dx. Open bimalleolar fracture of right ankle Ht: 5'11" Wt: 212lbs  Patient Name Russell Luna, Russell Luna ([id number]) Sex Male DOB 1946/07/14  Room Bed  A339 A339-01  Patient Demographics   Address [address]  [place] [address] Phone [phone] [facility](Home) [phone] (Work)  Patient Ethnicity & Race   Ethnic Group Patient Race  Not Hispanic or Latino White or Caucasian  Emergency Contact(s)   Name Relation Home Work Mobile  Sharon SellerSLADKY,EVELYN Spouse [phone]  [phone]  Documents on File    Status Date Received Description  Documents for the Patient  [place] HIPAA NOTICE OF PRIVACY - Scanned Not Received    [facility] E-Signature HIPAA Notice of Privacy     Driver's License Received 12/09/17 ncdl  Insurance Card Received 12/09/17 [facility]  Advance Directives/Living Will/HCPOA/POA Not Received    Other Photo ID Not Received    Documents for the Encounter  AOB (Assignment of Insurance Benefits) Received 12/09/17   E-signature AOB     MEDICARE RIGHTS Not Received    E-signature Medicare Rights     ED Patient Billing Extract   ED PB Billing Extract  Photos     Photos     Photos     Photos     Photos     Cardiac Monitoring Strip Received 12/10/17   Cardiac Monitoring Strip Shift Summary Received 12/10/17   Consent Form Received 12/10/17   Photos     Photos     Photos     EKG Received 12/10/17   Admission Information   Attending Provider Admitting Provider Admission Type Admission Date/Time  Vickki HearingHarrison, Stanley E, MD Vickki HearingHarrison, Stanley E, MD Emergency 12/09/17 1059  Discharge Date Hospital Service Auth/Cert Status Service Area   Surgery Incomplete [place] SERVICE AREA  Unit Room/Bed Admission Status   AP-DEPT 300 A339/A339-01 Admission (Confirmed)   Admission   Complaint  ankle injury  Hospital Account   Name Acct ID Class Status Primary Coverage  Russell Luna, Russell Luna [id number] Inpatient Open [facility]C[employer] [facility]IC[facility]  [facility]ICARE      Guarantor Account (for Hospital Account [id number])   Name Relation to Pt Service Area Active? Acct Type  Russell Luna, Russell Luna Self CHSA Yes Personal/Family  Address Phone    [address] [place], [place] [address] [phone](H) [phone](O)        Coverage Information (for Hospital Account [id number])   F/O Payor/Plan Precert #  [facility]UHC MEDIC[facility]   Subscriber Subscriber #  Russell Luna, Russell Luna [id number]  Address Phone  [address] [place], [place] [id number] [phone]

## 2017-12-12 NOTE — Progress Notes (Signed)
Physical Therapy Treatment Patient Details Name: Russell Luna MRN: [id number] DOB: 04/11/1946 Today's Date: 12/12/2017    History of Present Illness Postoperative day 3 status post open treatment internal fixation open type II bimalleolar ankle fracture; cast applied this am.    PT Comments    Patient tolerated treatment well today. Was able to ambulate with a standard walker stating he felt more stable than with rolling walker. Increased ambulation distance to 100 feet utilizing his upper body strength well and adhering to NWB on RLE. Patient reported he was fatigue post session and had some increase in pain overall but felt good. PLAN: Consult with case manager regarding patient's progress and probable ability to DC to home with home health PT and regarding acquiring a standard walker for use in patient's home environment and a CAM walker for community distances for ambulation while adhering to NWB precautions on RLE. Continue with current plan for treatment to increase strength, endurance, balance, and mobility skills.     Follow Up Recommendations  Home health PT;Supervision for mobility/OOB     Equipment Recommendations  Standard walker;Other (comment)(CAM walker)    Recommendations for Other Services       Precautions / Restrictions Precautions Precautions: Fall Restrictions Weight Bearing Restrictions: Yes RLE Weight Bearing: Non weight bearing    Mobility  Bed Mobility Overal bed mobility: Modified Independent Bed Mobility: Supine to Sit;Sit to Supine Rolling: Modified independent (Device/Increase time) Sidelying to sit: Supervision Supine to sit: Supervision Sit to supine: Min guard      Transfers Overall transfer level: Needs assistance Equipment used: Standard walker Transfers: Sit to/from Stand Sit to Stand: Min assist(for RLE)         General transfer comment: required extra time to sit to stand and min assist to maintain NWB on  RLE  Ambulation/Gait Ambulation/Gait assistance: Min guard Ambulation Distance (Feet): 100 Feet Assistive device: Standard walker       General Gait Details: NWB RLE, good pressure through BLEs on rolling walker   Stairs            Wheelchair Mobility    Modified Rankin (Stroke Patients Only)       Balance Overall balance assessment: Modified Independent Sitting-balance support: Bilateral upper extremity supported Sitting balance-Leahy Scale: Good     Standing balance support: Bilateral upper extremity supported Standing balance-Leahy Scale: Fair                              Cognition Arousal/Alertness: Awake/alert Behavior During Therapy: WFL for tasks assessed/performed Overall Cognitive Status: Within Functional Limits for tasks assessed                                        Exercises Total Joint Exercises Ankle Circles/Pumps: AROM;Seated;Left;10 reps Straight Leg Raises: AROM;Supine;Strengthening;Both;10 reps Long Arc Quad: AROM;Seated;Both;10 Ecologistreps Marching in Standing: AROM;Seated;Both;10 reps    General Comments        Pertinent Vitals/Pain Faces Pain Scale: Hurts little more Pain Location: right ankle Pain Intervention(s): Monitored during session    Home Living                      Prior Function            PT Goals (current goals can now be found in the care plan section) Acute Rehab PT Goals  Patient Stated Goal: Return home PT Goal Formulation: With patient/family Time For Goal Achievement: 12/15/17 Potential to Achieve Goals: Good Progress towards PT goals: Progressing toward goals    Frequency           PT Plan Discharge plan needs to be updated;Equipment recommendations need to be updated    Co-evaluation              AM-PAC PT "6 Clicks" Daily Activity  Outcome Measure  Difficulty turning over in bed (including adjusting bedclothes, sheets and blankets)?: A  Little Difficulty moving from lying on back to sitting on the side of the bed? : A Little Difficulty sitting down on and standing up from a chair with arms (e.g., wheelchair, bedside commode, etc,.)?: A Little Help needed moving to and from a bed to chair (including a wheelchair)?: A Little Help needed walking in hospital room?: A Little Help needed climbing 3-5 steps with a railing? : A Lot 6 Click Score: 17    End of Session   Activity Tolerance: Patient limited by fatigue Patient left: in bed;with call bell/phone within reach;with bed alarm set Nurse Communication: Mobility status       Time: [id number] PT Time Calculation (min) (ACUTE ONLY): 30 min  Charges:  $Gait Training: 8-22 mins $Therapeutic Activity: 8-22 mins                    G Codes:  Functional Assessment Tool Used: AM-PAC 6 Clicks Basic Mobility Functional Limitation: Mobility: Walking and moving around Mobility: Walking and Moving Around Current Status ([phone]): At least 40 percent but less than 60 percent impaired, limited or restricted Mobility: Walking and Moving Around Goal Status [phone]): At least 40 percent but less than 60 percent impaired, limited or restricted Mobility: Walking and Moving Around Discharge Status [phone]): At least 40 percent but less than 60 percent impaired, limited or restricted Carrying, Moving and Handling Objects Current Status [phone]([phone]): At least 40 percent but less than 60 percent impaired, limited or restricted Carrying, Moving and Handling Objects Goal Status [phone](G8985): At least 40 percent but less than 60 percent impaired, limited or restricted Carrying, Moving and Handling Objects Discharge Status [phone](G8986): At least 40 percent but less than 60 percent impaired, limited or restricted    Katina DungBarbara D. Hartnett-Rands, MS, PT Per Diem PT [facility]  [phone]

## 2017-12-12 NOTE — Care Management Note (Addendum)
Case Management Note  Patient Details  Name: Russell Luna MRN: [id number] Date of Birth: 04-20-1946  Subjective/Objective:    Admitted after repair of ankle fracture. Pt is from home, lives with wife. He is ind at baseline. He will not be non-wt bearing. Originally recommended for SNF. Pt has now progressed to the point were he could maneuver at home (which is his preference), will need walker and HH PT.  Pt has requested [facility] for DME and [facility]. Pt/wife asking about "rolling knee walker". Aware they are for rent through [facility] for $85/month, no not need MD order.              Action/Plan: Tim Justis, [facility] rep, aware of referral and will pull pt info from chart. [facility]. CM has contacted [facility] to verify order was received and DME will be delivered to pt room prior to DC. DC anticipated for tomorrow. Cm will follow.   Expected Discharge Date:     12/13/2017             Expected Discharge Plan:  Home w Home Health Services  In-House Referral:  NA  Discharge planning Services  CM Consult  Post Acute Care Choice:  Home Health, Durable Medical Equipment Choice offered to:  Patient  DME Arranged:  Dan HumphreysWalker DME Agency:  [facility]  HH Arranged:  PT HH Agency:  [facility]  Status of Service:  In process, will continue to follow  If discussed at Long Length of Stay Meetings, dates discussed:    Additional Comments:  Malcolm MetroChildress, Zuriel Roskos Demske, RN 12/12/2017, 2:07 PM

## 2017-12-12 NOTE — Brief Op Note (Addendum)
12/12/2017  10:14 AM  PATIENT:  Russell Luna  71 y.o. male  PRE-OPERATIVE DIAGNOSIS:  OPEN RIGHT ANKLE FRACTURE OPEN 3,4,5 PROX PHAL FRACTURES  POST-OPERATIVE DIAGNOSIS:  Same  PROCEDURE:  Procedure(s): MINOR CAST APPLICATION RIGHT FOOT AND ANKLE (Right)   (Application of sugar tong splint)  Details of procedure and findings  All wounds were clean without erythema.  The fourth and fifth digits had adequate capillary refill and color.  Sensation was decreased in the fifth moderate in the fourth normal and the third  After site marking timeout chart update and review the wounds were checked redressed and sugar tong splint was applied  No complications were noted  Radiograph was obtained  SURGEON:  Surgeon(s) and Role:    * Vickki HearingHarrison, Junella Domke E, MD - Primary  PHYSICIAN ASSISTANT:   ASSISTANTS: none   ANESTHESIA:   none  EBL:  none   BLOOD ADMINISTERED:none  DRAINS: none   LOCAL MEDICATIONS USED:  none  SPECIMEN:  No Specimen  DISPOSITION OF SPECIMEN:  N/A  COUNTS:  YES  TOURNIQUET:  * No tourniquets in log *  DICTATION: .Dragon Dictation  PLAN OF CARE: Admit to inpatient   PATIENT DISPOSITION:  PACU - hemodynamically stable.   Delay start of Pharmacological VTE agent (>24hrs) due to surgical blood loss or risk of bleeding: not applicable

## 2017-12-12 NOTE — Op Note (Signed)
12/12/2017  10:14 AM  PATIENT:  Russell Luna  71 y.o. male  PRE-OPERATIVE DIAGNOSIS:  OPEN RIGHT ANKLE FRACTURE OPEN 3,4,5 PROX PHAL FRACTURES  POST-OPERATIVE DIAGNOSIS:  Same  PROCEDURE:  Procedure(s): MINOR CAST APPLICATION RIGHT FOOT AND ANKLE (Right)   (Application of sugar tong splint)  Details of procedure and findings  All wounds were clean without erythema.  The fourth and fifth digits had adequate capillary refill and color.  Sensation was decreased in the fifth moderate in the fourth normal and the third  After site marking timeout chart update and review the wounds were checked redressed and sugar tong splint was applied  No complications were noted  Radiograph was obtained  SURGEON:  Surgeon(s) and Role:    * Cruise Baumgardner E, MD - Primary  PHYSICIAN ASSISTANT:   ASSISTANTS: none   ANESTHESIA:   none  EBL:  none   BLOOD ADMINISTERED:none  DRAINS: none   LOCAL MEDICATIONS USED:  none  SPECIMEN:  No Specimen  DISPOSITION OF SPECIMEN:  N/A  COUNTS:  YES  TOURNIQUET:  * No tourniquets in log *  DICTATION: .Dragon Dictation  PLAN OF CARE: Admit to inpatient   PATIENT DISPOSITION:  PACU - hemodynamically stable.   Delay start of Pharmacological VTE agent (>24hrs) due to surgical blood loss or risk of bleeding: not applicable  

## 2017-12-12 NOTE — Interval H&P Note (Signed)
History and Physical Interval Note:  12/12/2017 9:50 AM  Russell Luna  has presented today for surgery, with the diagnosis of OPEN RIGHT ANKLE FRACTURE OPEN 3,4,5 PROX PHAL FRACTURES  The various methods of treatment have been discussed with the patient and family. After consideration of risks, benefits and other options for treatment, the patient has consented to  Procedure(s): MINOR CAST APPLICATION RIGHT FOOT AND ANKLE (Right) as a surgical intervention .  The patient's history has been reviewed, patient examined, no change in status, stable for surgery.  I have reviewed the patient's chart and labs.  Questions were answered to the patient's satisfaction.     Fuller CanadaStanley Harrison

## 2017-12-13 ENCOUNTER — Encounter (HOSPITAL_COMMUNITY): Payer: Self-pay | Admitting: Orthopedic Surgery

## 2017-12-13 MED ORDER — GABAPENTIN 100 MG PO CAPS: 200.0000 mg | ORAL_CAPSULE | Freq: Three times a day (TID) | ORAL | 1 refills | Status: DC

## 2017-12-13 MED ORDER — IBUPROFEN 400 MG PO TABS: 400.0000 mg | ORAL_TABLET | Freq: Four times a day (QID) | ORAL | 0 refills | Status: DC

## 2017-12-13 MED ORDER — MORPHINE SULFATE 15 MG PO TABS: 15.0000 mg | ORAL_TABLET | ORAL | 0 refills | Status: DC | PRN

## 2017-12-13 NOTE — Care Management Important Message (Signed)
Important Message  Patient Details  Name: Jacqlyn Krausshomas K Battisti MRN: [id number] Date of Birth: Jan 27, 1946   Medicare Important Message Given:  Yes    Malcolm MetroChildress, Cathe Bilger Demske, RN 12/13/2017, 2:15 PM

## 2017-12-13 NOTE — Progress Notes (Signed)
Physical Therapy Treatment Patient Details Name: Russell Luna MRN: [id number] DOB: Apr 23, 1946 Today's Date: 12/13/2017    History of Present Illness Postoperative day 3 status post open treatment internal fixation open type II bimalleolar ankle fracture; cast applied yesterday. Admitted after repair of ankle fracture. Pt is from home, lives with wife. He is ind at baseline. He will not be non-wt bearing. Originally recommended for SNF. Pt has now progressed to the point were he could maneuver at home (which is his preference), will need walker and HH PT.  Pt has requested [facility] and [facility]. Pt/wife asking about "rolling knee walker". Aware they are for rent through [facility] for $85/month, no not need MD order.    PT Comments    Patient tolerated treatment fairly well with signs of fatigue at end of session. Patient had just received pain medicine before PT arrival. Able to ambulate further today. Improved sit to/from stand with verbal cues to keep right knee extended. Verbal cues while ambulating to not take such large steps with left lower extremity and walker so not tempted to set right lower extremity on floor before accepting weight through arms on walker. Patient continues to benefit from physical therapy in current setting and will continue to benefit in next venue to continue increased strength, balance, endurance, and functional mobility and safety skills.    Follow Up Recommendations  Home health PT;Supervision for mobility/OOB     Equipment Recommendations  Standard walker;Other (comment)    Recommendations for Other Services       Precautions / Restrictions Precautions Precautions: Fall Restrictions Weight Bearing Restrictions: Yes RLE Weight Bearing: Non weight bearing    Mobility  Bed Mobility Overal bed mobility: Independent Bed Mobility: Supine to Sit     Supine to sit: Modified independent (Device/Increase  time)        Transfers Overall transfer level: Needs assistance Equipment used: Standard walker Transfers: Sit to/from Stand Sit to Stand: Min assist(to hold down walker during transfer)         General transfer comment: patient able to maintain NWB on RLE during sit to stand transfers with verbal cues to shift RLE forward and extend R knee  Ambulation/Gait Ambulation/Gait assistance: Min guard Ambulation Distance (Feet): 150 Feet Assistive device: Standard walker     Gait velocity interpretation: Below normal speed for age/gender General Gait Details: NWB RLE, good pressure through BLEs on rolling walker; verbal cues to reach less far forward with walker so he is able to maintain NWB on RLE as patient was reaching farther forward and setting right ankle on floor prior to accepting weight through arms   Stairs            Wheelchair Mobility    Modified Rankin (Stroke Patients Only)       Balance Overall balance assessment: Modified Independent Sitting-balance support: Single extremity supported Sitting balance-Leahy Scale: Good     Standing balance support: Bilateral upper extremity supported Standing balance-Leahy Scale: Fair                              Cognition                                              Exercises Total Joint Exercises Ankle Circles/Pumps: AROM;Seated;Left;10  reps Long Arc Quad: AROM;Seated;Both;10 Ecologistreps Marching in Standing: AROM;Seated;Both;10 reps    General Comments        Pertinent Vitals/Pain Faces Pain Scale: Hurts little more Pain Location: right ankle Pain Intervention(s): Monitored during session;Premedicated before session    Home Living                      Prior Function            PT Goals (current goals can now be found in the care plan section) Acute Rehab PT Goals Patient Stated Goal: Return home PT Goal Formulation: With patient Time For Goal Achievement:  12/15/17 Potential to Achieve Goals: Good Progress towards PT goals: Progressing toward goals    Frequency    7X/week      PT Plan Current plan remains appropriate    Co-evaluation              AM-PAC PT "6 Clicks" Daily Activity  Outcome Measure  Difficulty turning over in bed (including adjusting bedclothes, sheets and blankets)?: None Difficulty moving from lying on back to sitting on the side of the bed? : None Difficulty sitting down on and standing up from a chair with arms (e.g., wheelchair, bedside commode, etc,.)?: A Little Help needed moving to and from a bed to chair (including a wheelchair)?: A Little Help needed walking in hospital room?: A Little Help needed climbing 3-5 steps with a railing? : A Lot 6 Click Score: 19    End of Session   Activity Tolerance: Patient limited by fatigue Patient left: in chair;with call bell/phone within reach Nurse Communication: Mobility status PT Visit Diagnosis: Unsteadiness on feet (R26.81);Other abnormalities of gait and mobility (R26.89);Muscle weakness (generalized) (M62.81)     Time: [id number] PT Time Calculation (min) (ACUTE ONLY): 31 min  Charges:  $Gait Training: 8-22 mins $Therapeutic Activity: 8-22 mins                    G Codes:  Functional Assessment Tool Used: AM-PAC 6 Clicks Basic Mobility Functional Limitation: Mobility: Walking and moving around Mobility: Walking and Moving Around Current Status ([phone](G8978): At least 20 percent but less than 40 percent impaired, limited or restricted Mobility: Walking and Moving Around Goal Status [phone](G8979): At least 20 percent but less than 40 percent impaired, limited or restricted Mobility: Walking and Moving Around Discharge Status [phone]): At least 20 percent but less than 40 percent impaired, limited or restricted Carrying, Moving and Handling Objects Current Status [phone]([phone]): At least 20 percent but less than 40 percent impaired, limited or restricted Carrying,  Moving and Handling Objects Goal Status [phone](G8985): At least 20 percent but less than 40 percent impaired, limited or restricted Carrying, Moving and Handling Objects Discharge Status [phone](G8986): At least 20 percent but less than 40 percent impaired, limited or restricted    Katina DungBarbara D. Hartnett-Rands, MS, PT Per Diem PT [facility]  [phone]

## 2017-12-13 NOTE — Discharge Summary (Signed)
Physician Discharge Summary  Patient ID: Russell Luna MRN: [id number] DOB/AGE: 1946-09-10 71 y.o.  Admit date: 12/09/2017 Discharge date: 12/13/2017  Admission Diagnoses: Open fracture bimalleolar right ankle Open fracture proximal phalanx third fourth and fifth digit right foot Second metatarsal fracture Cuboid fracture Anterior process calcaneus fracture  Discharge Diagnoses:  Active Problems:   Type I or II open bimalleolar fracture of right ankle   Open displaced fracture of proximal phalanx of lesser toe of right foot   Open fracture of right ankle, type I or II, initial encounter   Discharged Condition: good  Hospital Course: The patient came to the ER on 17 December he had an open fracture he was taken to surgery for irrigation debridement and open treatment internal fixation of the bimalleolar fracture.  The open fractures of the proximal phalanx of the third fourth and fifth digit of foot were irrigated and treated without fixation  He went back to the minor suite on Thursday for dressing changes.  His wounds look clean and dry he had a sugar tong splint applied x-rays show fracture reductions were maintained in the ankle  During his hospital course he underwent physical therapy he was on IV Ancef.  He had one low-grade temperature of 100.9 which was treated with incentive spirometry successfully  CBC Latest Ref Rng & Units 12/11/2017 12/09/2017  WBC 4.0 - 10.5 K/uL 15.4(H) 15.7(H)  Hemoglobin 13.0 - 17.0 g/dL 11.2(L) 15.0  Hematocrit 39.0 - 52.0 % 35.5(L) 45.5  Platelets 150 - 400 K/uL 198 237   BMP Latest Ref Rng & Units 12/11/2017 12/09/2017  Glucose 65 - 99 mg/dL 409(W113(H) 119(J156(H)  BUN 6 - 20 mg/dL 12 47(W24(H)  Creatinine 2.950.61 - 1.24 mg/dL 6.211.07 3.081.24  Sodium 657135 - 145 mmol/L 141 137  Potassium 3.5 - 5.1 mmol/L 4.4 4.6  Chloride 101 - 111 mmol/L 105 102  CO2 22 - 32 mmol/L 27 23  Calcium 8.9 - 10.3 mg/dL 8.9 9.7   Discharge Exam: Blood pressure 110/62, pulse 99,  temperature 99.2 F (37.3 C), temperature source Oral, resp. rate 20, height 5\' 11"  (1.803 m), weight 212 lb 1.3 oz (96.2 kg), SpO2 92 %.  Normal color the third digit good capillary refill and slight discoloration of the fourth digit, fifth digit right foot looks a little more dusky with sluggish capillary refill and it has decreased sensation globally, the fourth has decreased plantar sensation normal dorsal sensation    Disposition: Final discharge disposition not confirmed  Discharge Instructions    Call MD / Call 911   Complete by:  As directed    If you experience chest pain or shortness of breath, CALL 911 and be transported to the hospital emergency room.  If you develope a fever above 101 F, pus (white drainage) or increased drainage or redness at the wound, or calf pain, call your surgeon's office.   Constipation Prevention   Complete by:  As directed    Drink plenty of fluids.  Prune juice may be helpful.  You may use a stool softener, such as Colace (over the counter) 100 mg twice a day.  Use MiraLax (over the counter) for constipation as needed.   Diet - low sodium heart healthy   Complete by:  As directed    Discharge instructions   Complete by:  As directed    Elevate when not walking   No weight on foot   Increase activity slowly as tolerated   Complete by:  As directed  Allergies as of 12/13/2017   No Known Allergies     Medication List    TAKE these medications   atorvastatin 80 MG tablet Commonly known as:  LIPITOR Take 40 mg by mouth daily.   gabapentin 100 MG capsule Commonly known as:  NEURONTIN Take 2 capsules (200 mg total) by mouth 3 (three) times daily.   hydrochlorothiazide 25 MG tablet Commonly known as:  HYDRODIURIL Take 25 mg by mouth daily.   ibuprofen 400 MG tablet Commonly known as:  ADVIL,MOTRIN Take 1 tablet (400 mg total) by mouth every 6 (six) hours.   lisinopril 40 MG tablet Commonly known as:  PRINIVIL,ZESTRIL Take 10 mg  by mouth daily.   morphine 30 MG 12 hr tablet Commonly known as:  MS CONTIN Take 30 mg by mouth every 12 (twelve) hours. What changed:  Another medication with the same name was changed. Make sure you understand how and when to take each.   morphine 15 MG tablet Commonly known as:  MSIR Take 1 tablet (15 mg total) by mouth every 4 (four) hours as needed for severe pain. What changed:    when to take this  reasons to take this            Durable Medical Equipment  (From admission, onward)        Start     Ordered   12/12/17 1210  For home use only DME Dan HumphreysWalker  Once    Question:  Patient needs a walker to treat with the following condition  Answer:  Bimalleolar fracture of right ankle   12/12/17 1209     Follow-up Information    Vickki HearingHarrison, Anjulie Dipierro E, MD Follow up on 12/19/2017.   Specialties:  Orthopedic Surgery, Radiology Why:  Amy check wounds change dressings resplint  Contact information: [address] [place] [address] [phone]           Signed: Fuller CanadaStanley Michelena Culmer 12/13/2017, 1:05 PM

## 2017-12-13 NOTE — Progress Notes (Signed)
Patient is to be discharged home and in stable condition. Patient's IV and telemetry removed, WNL. Patient given discharge instructions and verbalized understanding. Patient will be escorted out by staff via wheelchair.  Skanda Worlds P Dishmon, RN  

## 2017-12-13 NOTE — Care Management Note (Signed)
Case Management Note  Patient Details  Name: Russell Luna MRN: [id number] Date of Birth: 1946/09/11  Expected Discharge Date:  12/13/17               Expected Discharge Plan:  Home w Home Health Services  In-House Referral:  NA  Discharge planning Services  CM Consult  Post Acute Care Choice:  Home Health, Durable Medical Equipment Choice offered to:  Patient  DME Arranged:  Dan HumphreysWalker DME Agency:  [facility]  HH Arranged:  PT HH Agency:  [facility]  Status of Service:  Completed, signed off  Additional Comments: Discharging home today with self care. Pt aware HH has 48 hrs to make first visit. Tim Justis, [facility] rep, aware of DC today and will pull pt info from chart. Pt's walker has been delivered from [facility].   Malcolm Metrohildress, Corrion Stirewalt Demske, RN 12/13/2017, 2:16 PM

## 2017-12-18 ENCOUNTER — Telehealth: Payer: Self-pay | Admitting: Radiology

## 2017-12-18 NOTE — Telephone Encounter (Signed)
Called patient to come in tomorrow. The MSIR 15mg  was not available at [facility], wife has asked if you can resend it to [facility] for them, since she has called, and they do have it in stock.

## 2017-12-19 ENCOUNTER — Other Ambulatory Visit: Payer: Self-pay | Admitting: Orthopedic Surgery

## 2017-12-19 ENCOUNTER — Ambulatory Visit (INDEPENDENT_AMBULATORY_CARE_PROVIDER_SITE_OTHER): Payer: Medicare Other | Admitting: Orthopedic Surgery

## 2017-12-19 ENCOUNTER — Encounter: Payer: Self-pay | Admitting: Orthopedic Surgery

## 2017-12-19 VITALS — BP 113/70 | HR 90

## 2017-12-19 DIAGNOSIS — S82841E Displaced bimalleolar fracture of right lower leg, subsequent encounter for open fracture type I or II with routine healing: Secondary | ICD-10-CM

## 2017-12-19 DIAGNOSIS — Z4889 Encounter for other specified surgical aftercare: Secondary | ICD-10-CM

## 2017-12-19 MED ORDER — MORPHINE SULFATE 15 MG PO TABS: 15.0000 mg | ORAL_TABLET | ORAL | 0 refills | Status: DC | PRN

## 2017-12-19 NOTE — Progress Notes (Signed)
Patient came in today for dressing change and splint application. Sutures/ staples intact. He has voiced concern regarding need for a wheelchair. [facility] this is signed. He was also unable to get the Morphine IR 15mg . I have sent messages to Dr Romeo AppleHarrison regarding this, patient states he [facility] 2 left [facility].   Meds ordered this encounter  Medications  . morphine (MSIR) 15 MG tablet    Sig: Take 1 tablet (15 mg total) by mouth every 4 (four) hours as needed for severe pain.    Dispense:  42 tablet    Refill:  0

## 2017-12-19 NOTE — Patient Instructions (Signed)
See you in one week, keep your leg iced and elevated

## 2017-12-23 DIAGNOSIS — Z8781 Personal history of (healed) traumatic fracture: Secondary | ICD-10-CM

## 2017-12-23 DIAGNOSIS — Z9889 Other specified postprocedural states: Secondary | ICD-10-CM | POA: Insufficient documentation

## 2017-12-26 ENCOUNTER — Ambulatory Visit (INDEPENDENT_AMBULATORY_CARE_PROVIDER_SITE_OTHER): Payer: Self-pay | Admitting: Orthopedic Surgery

## 2017-12-26 ENCOUNTER — Ambulatory Visit (INDEPENDENT_AMBULATORY_CARE_PROVIDER_SITE_OTHER): Payer: Medicare Other

## 2017-12-26 DIAGNOSIS — Z8781 Personal history of (healed) traumatic fracture: Secondary | ICD-10-CM

## 2017-12-26 DIAGNOSIS — S82841E Displaced bimalleolar fracture of right lower leg, subsequent encounter for open fracture type I or II with routine healing: Secondary | ICD-10-CM

## 2017-12-26 DIAGNOSIS — Z9889 Other specified postprocedural states: Secondary | ICD-10-CM

## 2017-12-26 DIAGNOSIS — Z967 Presence of other bone and tendon implants: Secondary | ICD-10-CM

## 2017-12-26 DIAGNOSIS — S92511D Displaced fracture of proximal phalanx of right lesser toe(s), subsequent encounter for fracture with routine healing: Secondary | ICD-10-CM

## 2017-12-26 NOTE — Progress Notes (Signed)
  Postop visit  Status post open treatment internal fixation open bimalleolar fracture grade II with multiple fractures of the foot  The fourth and fifth digit show the following  Fourth digit looks dusky.  Fifth digit looks pale.  He can move the toes he has no feeling.  As previously discussed may need amputations of those digits which we may get consultations for.  Wounds otherwise look good there is some surrounding erythema on the medial spine no means that there appear to be any infection his ankle x-rays show intact mortise with medial lateral plates  His lateral wound looks good medial wound looks good and I will take all the staples out today and then place him back in a sugar tong splint to allow for wound care and follow him in 2 weeks with repeat films including the foot and decide if the toe is going to make it or not  Encounter Diagnoses  Name Primary?  . Type I or II open bimalleolar fracture of right ankle with routine healing, subsequent encounter Yes  . Open displaced fracture of proximal phalanx of lesser toe of right foot with routine healing, subsequent encounter   . S/P ORIF (open reduction internal fixation) fracture 12/09/17

## 2017-12-27 ENCOUNTER — Ambulatory Visit: Payer: Medicare Other | Admitting: Orthopedic Surgery

## 2017-12-31 ENCOUNTER — Telehealth: Payer: Self-pay | Admitting: Orthopedic Surgery

## 2017-12-31 NOTE — Telephone Encounter (Signed)
Patient requests refill on Gabapentin 100 mgs.   Qty  60  Refill 1        Sig: Take 2 capsules (200 mg total) by mouth 3 (three) times daily.   Patient [place] [facility] [place] [facility] [place]

## 2018-01-01 ENCOUNTER — Other Ambulatory Visit: Payer: Self-pay | Admitting: Orthopedic Surgery

## 2018-01-01 MED ORDER — GABAPENTIN 100 MG PO CAPS: 200.0000 mg | ORAL_CAPSULE | Freq: Three times a day (TID) | ORAL | 1 refills | Status: DC

## 2018-01-02 ENCOUNTER — Telehealth: Payer: Self-pay | Admitting: Orthopedic Surgery

## 2018-01-02 ENCOUNTER — Other Ambulatory Visit: Payer: Self-pay | Admitting: Orthopedic Surgery

## 2018-01-02 DIAGNOSIS — Z4889 Encounter for other specified surgical aftercare: Secondary | ICD-10-CM

## 2018-01-02 MED ORDER — MORPHINE SULFATE 15 MG PO TABS: 15.0000 mg | ORAL_TABLET | ORAL | 0 refills | Status: DC | PRN

## 2018-01-02 NOTE — Telephone Encounter (Signed)
Patient requests refill on Morphine(MSIR) 15 mgs. Qty 42  Sig: Take 1 tablet (15 mg total) by mouth every 4 (four) hours as needed for severe pain.        Patient states he is using [facility] for this medication.

## 2018-01-08 ENCOUNTER — Ambulatory Visit (INDEPENDENT_AMBULATORY_CARE_PROVIDER_SITE_OTHER): Payer: Medicare Other

## 2018-01-08 ENCOUNTER — Encounter: Payer: Self-pay | Admitting: Orthopedic Surgery

## 2018-01-08 ENCOUNTER — Ambulatory Visit (INDEPENDENT_AMBULATORY_CARE_PROVIDER_SITE_OTHER): Payer: Self-pay | Admitting: Orthopedic Surgery

## 2018-01-08 VITALS — BP 142/89 | HR 86 | Resp 18

## 2018-01-08 DIAGNOSIS — Z967 Presence of other bone and tendon implants: Secondary | ICD-10-CM

## 2018-01-08 DIAGNOSIS — Z9889 Other specified postprocedural states: Secondary | ICD-10-CM

## 2018-01-08 DIAGNOSIS — Z8781 Personal history of (healed) traumatic fracture: Secondary | ICD-10-CM

## 2018-01-08 DIAGNOSIS — S92511D Displaced fracture of proximal phalanx of right lesser toe(s), subsequent encounter for fracture with routine healing: Secondary | ICD-10-CM

## 2018-01-08 DIAGNOSIS — S92514D Nondisplaced fracture of proximal phalanx of right lesser toe(s), subsequent encounter for fracture with routine healing: Secondary | ICD-10-CM

## 2018-01-08 NOTE — Progress Notes (Signed)
Postop visit  Chief Complaint  Patient presents with  . Post-op Follow-up    Surgery open fractures left foot right ankle   Open treatment internal fixation left ankle on December 09, 2017 for grade 2 open bimalleolar fracture with multiple open fractures of the foot including the proximal phalanx of the right proximal phalanx fifth and fourth digit  Patient comes in with dry gangrene of the tips of the fourth and fifth digit drainage and erythema around the foot and ankle with loss of superficial epithelium on the lateral portion of the foot the medial open wound is healing appropriately  The lateral open fracture wound is healing but there is extension of erythema into the foot up to the midportion of the incision of the lateral malleolus  X-ray of the fracture shows maintenance of reduction in the ankle  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  . S/P ORIF (open reduction internal fixation) fracture 12/09/17 Yes  . Open displaced fracture of proximal phalanx of lesser toe of right foot with routine healing, subsequent encounter   . Open nondisplaced fracture of proximal phalanx of lesser toe of right foot with routine healing, subsequent encounter

## 2018-01-09 ENCOUNTER — Telehealth: Payer: Self-pay | Admitting: Orthopedic Surgery

## 2018-01-09 ENCOUNTER — Telehealth: Payer: Self-pay | Admitting: Radiology

## 2018-01-09 MED ORDER — CIPROFLOXACIN HCL 500 MG PO TABS
500.0000 mg | ORAL_TABLET | Freq: Two times a day (BID) | ORAL | 1 refills | Status: DC
Start: 2018-01-09 — End: 2018-01-19

## 2018-01-09 NOTE — Telephone Encounter (Signed)
Call received from [facility], provider's office, Dr Marveen Reeksonald Murphy, per Charlynne Panderara, scheduler ph# [phone] requests copy of medical records from date of service 12/26/17 to most recent date, 01/08/18. HIM/Release, continuity of care, done as per request to fax# [phone]. Notified patient of status.

## 2018-01-09 NOTE — Addendum Note (Signed)
Addended by: Vickki HearingHARRISON, Olita Takeshita E on: 01/09/2018 01:21 PM   Modules accepted: Orders

## 2018-01-09 NOTE — Telephone Encounter (Signed)
I need to pre cert the case for him, [facility]  I looked up CPT code but not sure correct, can you help me? The code depends on how much of the toe you take.  Also will need the code for I and D.  thanks

## 2018-01-10 NOTE — Patient Instructions (Signed)
Russell Luna  01/10/2018     @PREFPERIOPPHARMACY @   Your procedure is scheduled on  01/16/2018 .  Report to [facility] at  1105   A.M.  Call this number if you have problems the morning of surgery:  [phone]   Remember:  Do not eat food or drink liquids after midnight.  Take these medicines the morning of surgery with A SIP OF WATER  Gabapentin, lisinopril, MS Contin, or MSIR.   Do not wear jewelry, make-up or nail polish.  Do not wear lotions, powders, or perfumes, or deodorant.  Do not shave 48 hours prior to surgery.  Men may shave face and neck.  Do not bring valuables to the hospital.  [facility] is not responsible for any belongings or valuables.  Contacts, dentures or bridgework may not be worn into surgery.  Leave your suitcase in the car.  After surgery it may be brought to your room.  For patients admitted to the hospital, discharge time will be determined by your treatment team.  Patients discharged the day of surgery will not be allowed to drive home.   Name and phone number of your driver:   family Special instructions:  None  Please read over the following fact sheets that you were given. Anesthesia Post-op Instructions and Care and Recovery After Surgery       Incision and Drainage Incision and drainage is a surgical procedure to open and drain a fluid-filled sac. The sac may be filled with pus, mucus, or blood. Examples of fluid-filled sacs that may need surgical drainage include cysts, skin infections (abscesses), and red lumps that develop from a ruptured cyst or a small abscess (boils). You may need this procedure if the affected area is large, painful, infected, or not healing well. Tell a health care provider about:  Any allergies you have.  All medicines you are taking, including vitamins, herbs, eye drops, creams, and over-the-counter medicines.  Any problems you or family members have had with anesthetic  medicines.  Any blood disorders you have.  Any surgeries you have had.  Any medical conditions you have.  Whether you are pregnant or may be pregnant. What are the risks? Generally, this is a safe procedure. However, problems may occur, including:  Infection.  Bleeding.  Allergic reactions to medicines.  Scarring.  What happens before the procedure?  You may need an ultrasound or other imaging tests to see how large or deep the fluid-filled sac is.  You may have blood tests to check for infection.  You may get a tetanus shot.  You may be given antibiotic medicine to help prevent infection.  Follow instructions from your health care provider about eating or drinking restrictions.  Ask your health care provider about: ? Changing or stopping your regular medicines. This is especially important if you are taking diabetes medicines or blood thinners. ? Taking medicines such as aspirin and ibuprofen. These medicines can thin your blood. Do not take these medicines before your procedure if your health care provider instructs you not to.  Plan to have someone take you home after the procedure.  If you will be going home right after the procedure, plan to have someone stay with you for 24 hours. What happens during the procedure?  To reduce your risk of infection: ? Your health care team will wash or sanitize their hands. ? Your skin will  be washed with soap.  You will be given one or more of the following: ? A medicine to help you relax (sedative). ? A medicine to numb the area (local anesthetic). ? A medicine to make you fall asleep (general anesthetic).  An incision will be made in the top of the fluid-filled sac.  The contents of the sac may be squeezed out, or a syringe or tube (catheter)may be used to empty the sac.  The catheter may be left in place for several weeks to drain any fluid. Or, your health care provider may stitch open the edges of the incision to  make a long-term opening for drainage (marsupialization).  The inside of the sac may be washed out (irrigated) with a sterile solution and packed with gauze before it is covered with a bandage (dressing). The procedure may vary among health care providers and hospitals. What happens after the procedure?  Your blood pressure, heart rate, breathing rate, and blood oxygen level will be monitored often until the medicines you were given have worn off.  Do not drive for 24 hours if you received a sedative. This information is not intended to replace advice given to you by your health care provider. Make sure you discuss any questions you have with your health care provider. Document Released: 06/05/2001 Document Revised: 05/17/2016 Document Reviewed: 09/30/2015 Elsevier Interactive Patient Education  2018 Elsevier Inc.  Incision and Drainage, Care After Refer to this sheet in the next few weeks. These instructions provide you with information about caring for yourself after your procedure. Your health care provider may also give you more specific instructions. Your treatment has been planned according to current medical practices, but problems sometimes occur. Call your health care provider if you have any problems or questions after your procedure. What can I expect after the procedure? After the procedure, it is common to have:  Pain or discomfort around your incision site.  Drainage from your incision.  Follow these instructions at home:  Take over-the-counter and prescription medicines only as told by your health care provider.  If you were prescribed an antibiotic medicine, take it as told by your health care provider.Do not stop taking the antibiotic even if you start to feel better.  Followinstructions from your health care provider about: ? How to take care of your incision. ? When and how you should change your packing and bandage (dressing). Wash your hands with soap and water  before you change your dressing. If soap and water are not available, use hand sanitizer. ? When you should remove your dressing.  Do not take baths, swim, or use a hot tub until your health care provider approves.  Keep all follow-up visits as told by your health care provider. This is important.  Check your incision area every day for signs of infection. Check for: ? More redness, swelling, or pain. ? More fluid or blood. ? Warmth. ? Pus or a bad smell. Contact a health care provider if:  Your cyst or abscess returns.  You have a fever.  You have more redness, swelling, or pain around your incision.  You have more fluid or blood coming from your incision.  Your incision feels warm to the touch.  You have pus or a bad smell coming from your incision. Get help right away if:  You have severe pain or bleeding.  You cannot eat or drink without vomiting.  You have decreased urine output.  You become short of breath.  You have  chest pain.  You cough up blood.  The area where the incision and drainage occurred becomes numb or it tingles. This information is not intended to replace advice given to you by your health care provider. Make sure you discuss any questions you have with your health care provider. Document Released: 03/03/2012 Document Revised: 05/11/2016 Document Reviewed: 09/30/2015 Elsevier Interactive Patient Education  2018 Elsevier Inc. Phantom Limb Pain Phantom limb pain occurs in an arm or leg following an amputation. It is pain in an extremity that no longer exists. This pain varies with different patients. Different activities may cause the pain. Some people with an amputated limb experience the opposite of phantom pain, which is phantom pleasure. The trouble may start in a part of the brain known as the sensory cortex. The sensory cortex is the portion of your brain that processes sensations from the rest of your body. It is hypothesized that when a body  part is lost, the corresponding part of the brain is not able to handle the loss and rewires its circuitry to make up for the signals it no longer receives from the missing extremity. The exact mechanism of how phantom limb pain occurs is not known. The severity of pain seems to be correlated with personal problems such as stress and attitude. It also seems to correlate with the amount of pain a person had before the operation. What are the causes?  Damaged nerve endings.  Scar tissue at the amputation site. How is this treated? Phantom limb pain can be severe and debilitating. Most cases of phantom limb pain only last briefly. There are a number of different therapies and medications that may give relief. Keep working with your health care provider until relief is obtained. Some treatments that may be helpful include:  Hypnosis and mental imagery. Their techniques can give patients the needed impetus to recognize their ability to regain control.  Biofeedback.  Relaxation techniques. They are related to hypnosis techniques and use the mind and body to control pain.  Acupuncture.  Massage.  Exercise.  Antidepressant medicine.  Anticonvulsant medicine.  Narcotics or pain medicines.  Contact a health care provider if: Pain is not relieved or increases. This information is not intended to replace advice given to you by your health care provider. Make sure you discuss any questions you have with your health care provider. Document Released: 03/02/2003 Document Revised: 08/21/2016 Document Reviewed: 05/12/2013 Elsevier Interactive Patient Education  2017 Elsevier Inc. Living With an Amputation The most common causes of amputation from the hip down include:  Diseases that: ? Reduce the blood flow to an area of your body. ? Decrease your body's ability to fight infection.  Traumatic injuries that cause significant damage to body tissues.  Birth defects.  Cancerous lumps  (malignant tumors). Amputation above the hip is usually the result of trauma or birth defect. Disease is a less common cause. Living with an amputation can be challenging, but you can still live a long, productive life. WHAT ARE THE COMMON CHALLENGES OF LIVING WITH AN AMPUTATION? The most common challenges are mobility and self-care. With some new habits, though, it is often possible to do all of the activities you used to do. You may be able to use a device that substitutes for your limb (prosthesis). The prosthesis helps you adapt more quickly to these challenges. Your health care provider can help select a prosthesis to meet your needs. A person who helps you choose and fits you with a prosthesis (prosthetist)  may also help. A rehabilitation program can also help you gain mobility and self-reliance. Your rehabilitation team may include:  Physicians.  Physical and occupational therapists.  Prosthetists.  Nurses.  Social workers.  Psychologists.  Dietitians. Your rehabilitation team will help you with all aspects of recovery and returning to work, home, sports, and your community. You will learn to:  Get around safely.  Adjust your home.  Exercise.  Use a prosthetic.  Work through Surveyor, quantity.  Connect with other people who have gone through the same experience. Additional challenges may include:  Grieving period.  Body image issues.  Lifestyle issues, such as sex.  Maintaining a healthy weight. These issues are normal. Discuss these with your rehabilitation team. WHEN CAN I RETURN TO MY REGULAR ACTIVITIES?  Returning to your normal activities is part of healing. Changes can often be made to equipment that allow you to return to a sport or hobby. Some Arboriculturist for this. Discuss all of your leisure interests with your health care provider and prosthetist. WHEN CAN I RETURN TO WORK? When you are ready to return to work, your therapists  can perform job site evaluations and make recommendations to help you perform your job. You may not be able to return to your same job. Your local Office of Vocational Rehabilitation can assist you in job retraining. FOR MORE INFORMATION: Visit these online resources. You can find tips on everything from getting dressed and using bathrooms to driving and travel considerations. These resources can also connect you to a network of emotional support, activities, and innovations. You may also search the Internet to find a local support group.  Amputee Coalition: http://www.amputee-coalition.org/ensuring-fall-safety/  Amputee Support Groups: http://amputee.supportgroups.com  Daily Strength: http://melendez.com/  [employer]: http://www.nationalamputation.[facility] on Health, Physical Activity and Disability: http://www.nchpad.org  Disabled Sports [place]: http://www.disabledsportsusa.org  American Academy of Orthotists and Prosthetists: http://www.oandp.org This information is not intended to replace advice given to you by your health care provider. Make sure you discuss any questions you have with your health care provider. Document Released: 09/01/2002 Document Revised: 12/31/2014 Document Reviewed: 04/27/2014 Elsevier Interactive Patient Education  2017 Elsevier Inc. Toe Amputation Toe amputation is a surgical procedure to remove all or part of a toe. You may have this procedure if:  Tissue in your toe is dying because of poor blood supply.  You have a severe infection in your toe.  Removing your toe keeps nearby tissue healthy. If the toe is infected, removing it helps to keep the infection from spreading. Tell a health care provider about:  Any allergies you have.  All medicines you are taking, including vitamins, herbs, eye drops, creams, and over-the-counter medicines.  Any problems you or family members have had with  anesthetic medicines.  Any blood disorders you have.  Any surgeries you have had.  Any medical conditions you have.  Whether you are pregnant or may be pregnant. What are the risks? Generally, this is a safe procedure. However, problems may occur, including:  Bleeding.  Buildup of blood and fluid (hematoma).  Infection.  Allergic reactions to medicines.  Tissue death in the flap of skin (flap necrosis).  Trouble with healing.  Minor changes in the way that you walk (your gait).  Feeling pain in the area that was removed (phantom pain). This is rare.  What happens before the procedure?  Follow instructions from your health care provider about eating or drinking restrictions.  Ask your health care provider about: ? Changing  or stopping your regular medicines. This is especially important if you are taking diabetes medicines or blood thinners. ? Taking medicines such as aspirin and ibuprofen. These medicines can thin your blood. Do not take these medicines before your procedure if your health care provider instructs you not to.  You may be given antibiotic medicine to help prevent infection.  Plan to have someone take you home after the procedure.  If you will be going home right after the procedure, plan to have someone with you for 24 hours. What happens during the procedure?  You will be given one or more of the following: ? A medicine to help you relax (sedative). ? A medicine to numb the area (local anesthetic). ? A medicine to make you fall asleep (general anesthetic). ? A medicine that is injected into your spine to numb the area below and slightly above the injection site (spinal anesthetic). ? A medicine that is injected into an area of your body to numb everything below the injection site (regional anesthetic).  To reduce your risk of infection: ? Your health care team will wash or sanitize their hands. ? Your skin will be washed with soap.  Your surgeon  will mark the area of your toe for removal.  Your surgeon will make a surgical cut (incision) in your toe.  The dead tissue and bone will be removed.  Nerves and vessels will be tied or heated with a special tool to stop bleeding.  The area will be drained and cleaned.  If only part of a toe is removed, the remaining part will be covered with a flap of skin.  The incision will be treated in one of these ways: ? It will be closed with stitches (sutures). ? It will be left open to heal if there is an infection.  The incision area may be packed with gauze and covered with bandages (dressings).  Tissue samples may be sent to a lab to be examined under a microscope. The procedure may vary among health care providers and hospitals. What happens after the procedure?  Your blood pressure, heart rate, breathing rate, and blood oxygen level will be monitored often until the medicines you were given have worn off.  Your foot will be raised up high (elevated) to relieve swelling.  You will be monitored for pain.  You will be given pain medicines and antibiotics.  Your health care provider or physical therapist will help you to move around as soon as possible.  Do not drive for 24 hours if you received a sedative. This information is not intended to replace advice given to you by your health care provider. Make sure you discuss any questions you have with your health care provider. Document Released: 11/21/2015 Document Revised: 08/13/2016 Document Reviewed: 09/03/2015 Elsevier Interactive Patient Education  2018 Elsevier Inc.  Toe Amputation, Care After Refer to this sheet in the next few weeks. These instructions provide you with information on caring for yourself after your procedure. Your health care provider may also give you more specific instructions. Your treatment has been planned according to current medical practices, but problems sometimes occur. Call your health care provider  if you have any problems or questions after your procedure. What can I expect after the procedure? After your procedure, it is common to have some pain. Pain usually improves within a week. Follow these instructions at home: Medicines  Take your antibiotic medicine as told by your health care provider. Do not stop taking the  antibiotic even if you start to feel better.  Take over-the-counter and prescription medicines only as told by your health care provider. Managing pain, stiffness, and swelling   If directed, apply ice to the surgical area: ? Put ice in a plastic bag. ? Place a towel between your skin and the bag. ? Leave the ice on for 20 minutes, 2-3 times a day.  Raise (elevate) your foot so it is above the level of your heart. This helps to reduce swelling.  Try to walk each day. Incision care   Follow instructions from your health care provider about how to take care of your cut from surgery (incision). Make sure you: ? Wash your hands with soap and water before you change your bandage (dressing). If soap and water are not available, use hand sanitizer. ? Change your dressing as told by your health care provider. ? If you got stitches (sutures), leave them in place. They may need to stay in place for 2 weeks or longer.  Check your incision area every day for signs of infection. Check for: ? More redness, swelling, or pain. ? More fluid or blood. ? Warmth. ? Pus or a bad smell. Bathing  Do not take baths, swim, use a hot tub, or soak your foot until your health care provider approves.  You may shower unless you were told not to. When you shower, keep your dressing dry.  If your dressing has been removed, you may wash your skin with warm water and soap. Driving  Do not drive for 24 hours if you received a sedative.  Do not drive or operate heavy machinery while taking prescription pain medicine. Activity  Do exercises as told by your health care  provider.  Return to your normal activities as told by your health care provider. Ask your health care provider what activities are safe for you. General instructions  Do not use any tobacco products, such as cigarettes, chewing tobacco, and e-cigarettes. If you need help quitting, ask your health care provider.  Ask your health care provider about wearing special shoes or using inserts to support your foot.  If you have diabetes, keep your blood sugar under control.  Keep all follow-up visits as told by your health care provider. This is important. Contact a health care provider if:  You have more redness around your incision.  You have more fluid or blood coming from your incision.  Your incision feels warm to the touch.  You have pus or a bad smell coming from your incision.  You have a fever.  Your dressing is soaked with blood.  Your sutures tear or they separate.  You have numbness or tingling in your toes or foot.  Your foot is cool or pale, or it changes color.  Your pain does not improve after you take your medicine. Get help right away if:  You have pain or swelling that gets worse or does not go away.  You have red streaks on your skin near your toes, foot, or leg.  You have pain in your calf or behind your knee.  You have shortness of breath.  You have chest pain. This information is not intended to replace advice given to you by your health care provider. Make sure you discuss any questions you have with your health care provider. Document Released: 11/21/2015 Document Revised: 08/13/2016 Document Reviewed: 09/03/2015 Elsevier Interactive Patient Education  2018 [employer].  General Anesthesia, Adult General anesthesia is the use of medicines  to make a person "go to sleep" (be unconscious) for a medical procedure. General anesthesia is often recommended when a procedure:  Is long.  Requires you to be still or in an unusual position.  Is major and  can cause you to lose blood.  Is impossible to do without general anesthesia.  The medicines used for general anesthesia are called general anesthetics. In addition to making you sleep, the medicines:  Prevent pain.  Control your blood pressure.  Relax your muscles.  Tell a health care provider about:  Any allergies you have.  All medicines you are taking, including vitamins, herbs, eye drops, creams, and over-the-counter medicines.  Any problems you or family members have had with anesthetic medicines.  Types of anesthetics you have had in the past.  Any bleeding disorders you have.  Any surgeries you have had.  Any medical conditions you have.  Any history of heart or lung conditions, such as heart failure, sleep apnea, or chronic obstructive pulmonary disease (COPD).  Whether you are pregnant or may be pregnant.  Whether you use tobacco, alcohol, marijuana, or street drugs.  Any history of Financial planner.  Any history of depression or anxiety. What are the risks? Generally, this is a safe procedure. However, problems may occur, including:  Allergic reaction to anesthetics.  Lung and heart problems.  Inhaling food or liquids from your stomach into your lungs (aspiration).  Injury to nerves.  Waking up during your procedure and being unable to move (rare).  Extreme agitation or a state of mental confusion (delirium) when you wake up from the anesthetic.  Air in the bloodstream, which can lead to stroke.  These problems are more likely to develop if you are having a major surgery or if you have an advanced medical condition. You can prevent some of these complications by answering all of your health care provider's questions thoroughly and by following all pre-procedure instructions. General anesthesia can cause side effects, including:  Nausea or vomiting  A sore throat from the breathing tube.  Feeling cold or shivery.  Feeling tired, washed out, or  achy.  Sleepiness or drowsiness.  Confusion or agitation.  What happens before the procedure? Staying hydrated Follow instructions from your health care provider about hydration, which may include:  Up to 2 hours before the procedure - you may continue to drink clear liquids, such as water, clear fruit juice, black coffee, and plain tea.  Eating and drinking restrictions Follow instructions from your health care provider about eating and drinking, which may include:  8 hours before the procedure - stop eating heavy meals or foods such as meat, fried foods, or fatty foods.  6 hours before the procedure - stop eating light meals or foods, such as toast or cereal.  6 hours before the procedure - stop drinking milk or drinks that contain milk.  2 hours before the procedure - stop drinking clear liquids.  Medicines  Ask your health care provider about: ? Changing or stopping your regular medicines. This is especially important if you are taking diabetes medicines or blood thinners. ? Taking medicines such as aspirin and ibuprofen. These medicines can thin your blood. Do not take these medicines before your procedure if your health care provider instructs you not to. ? Taking new dietary supplements or medicines. Do not take these during the week before your procedure unless your health care provider approves them.  If you are told to take a medicine or to continue taking a medicine  on the day of the procedure, take the medicine with sips of water. General instructions   Ask if you will be going home the same day, the following day, or after a longer hospital stay. ? Plan to have someone take you home. ? Plan to have someone stay with you for the first 24 hours after you leave the hospital or clinic.  For 3-6 weeks before the procedure, try not to use any tobacco products, such as cigarettes, chewing tobacco, and e-cigarettes.  You may brush your teeth on the morning of the  procedure, but make sure to spit out the toothpaste. What happens during the procedure?  You will be given anesthetics through a mask and through an IV tube in one of your veins.  You may receive medicine to help you relax (sedative).  As soon as you are asleep, a breathing tube may be used to help you breathe.  An anesthesia specialist will stay with you throughout the procedure. He or she will help keep you comfortable and safe by continuing to give you medicines and adjusting the amount of medicine that you get. He or she will also watch your blood pressure, pulse, and oxygen levels to make sure that the anesthetics do not cause any problems.  If a breathing tube was used to help you breathe, it will be removed before you wake up. The procedure may vary among health care providers and hospitals. What happens after the procedure?  You will wake up, often slowly, after the procedure is complete, usually in a recovery area.  Your blood pressure, heart rate, breathing rate, and blood oxygen level will be monitored until the medicines you were given have worn off.  You may be given medicine to help you calm down if you feel anxious or agitated.  If you will be going home the same day, your health care provider may check to make sure you can stand, drink, and urinate.  Your health care providers will treat your pain and side effects before you go home.  Do not drive for 24 hours if you received a sedative.  You may: ? Feel nauseous and vomit. ? Have a sore throat. ? Have mental slowness. ? Feel cold or shivery. ? Feel sleepy. ? Feel tired. ? Feel sore or achy, even in parts of your body where you did not have surgery. This information is not intended to replace advice given to you by your health care provider. Make sure you discuss any questions you have with your health care provider. Document Released: 03/18/2008 Document Revised: 05/22/2016 Document Reviewed: 11/24/2015 Elsevier  Interactive Patient Education  2018 [employer]. General Anesthesia, Adult, Care After These instructions provide you with information about caring for yourself after your procedure. Your health care provider may also give you more specific instructions. Your treatment has been planned according to current medical practices, but problems sometimes occur. Call your health care provider if you have any problems or questions after your procedure. What can I expect after the procedure? After the procedure, it is common to have:  Vomiting.  A sore throat.  Mental slowness.  It is common to feel:  Nauseous.  Cold or shivery.  Sleepy.  Tired.  Sore or achy, even in parts of your body where you did not have surgery.  Follow these instructions at home: For at least 24 hours after the procedure:  Do not: ? Participate in activities where you could fall or become injured. ? Drive. ? Use  heavy machinery. ? Drink alcohol. ? Take sleeping pills or medicines that cause drowsiness. ? Make important decisions or sign legal documents. ? Take care of children on your own.  Rest. Eating and drinking  If you vomit, drink water, juice, or soup when you can drink without vomiting.  Drink enough fluid to keep your urine clear or pale yellow.  Make sure you have little or no nausea before eating solid foods.  Follow the diet recommended by your health care provider. General instructions  Have a responsible adult stay with you until you are awake and alert.  Return to your normal activities as told by your health care provider. Ask your health care provider what activities are safe for you.  Take over-the-counter and prescription medicines only as told by your health care provider.  If you smoke, do not smoke without supervision.  Keep all follow-up visits as told by your health care provider. This is important. Contact a health care provider if:  You continue to have nausea or  vomiting at home, and medicines are not helpful.  You cannot drink fluids or start eating again.  You cannot urinate after 8-12 hours.  You develop a skin rash.  You have fever.  You have increasing redness at the site of your procedure. Get help right away if:  You have difficulty breathing.  You have chest pain.  You have unexpected bleeding.  You feel that you are having a life-threatening or urgent problem. This information is not intended to replace advice given to you by your health care provider. Make sure you discuss any questions you have with your health care provider. Document Released: 03/18/2001 Document Revised: 05/14/2016 Document Reviewed: 11/24/2015 Elsevier Interactive Patient Education  [employer]2018 [employer]

## 2018-01-10 NOTE — Telephone Encounter (Signed)
Use the first one in the book for the toe   10061  Code has to be close not exact   I ll place exact code after surgery

## 2018-01-13 ENCOUNTER — Encounter (HOSPITAL_COMMUNITY)
Admission: RE | Admit: 2018-01-13 | Discharge: 2018-01-13 | Disposition: A | Discharge status: Home / Self Care | Payer: Medicare Other | Source: Ambulatory Visit | Attending: Orthopedic Surgery | Admitting: Orthopedic Surgery

## 2018-01-13 ENCOUNTER — Encounter (HOSPITAL_COMMUNITY): Payer: Self-pay

## 2018-01-13 ENCOUNTER — Telehealth: Payer: Self-pay | Admitting: Radiology

## 2018-01-13 ENCOUNTER — Other Ambulatory Visit: Payer: Self-pay

## 2018-01-13 LAB — CBC WITH DIFFERENTIAL/PLATELET
BASOS ABS: 0 10*3/uL (ref 0.0–0.1)
BASOS PCT: 0 %
EOS ABS: 0.7 10*3/uL (ref 0.0–0.7)
Eosinophils Relative: 6 %
HEMATOCRIT: 41 % (ref 39.0–52.0)
Hemoglobin: 13.1 g/dL (ref 13.0–17.0)
Lymphocytes Relative: 16 %
Lymphs Abs: 1.8 10*3/uL (ref 0.7–4.0)
MCH: 30.9 pg (ref 26.0–34.0)
MCHC: 32 g/dL (ref 30.0–36.0)
MCV: 96.7 fL (ref 78.0–100.0)
Monocytes Absolute: 1.1 10*3/uL — ABNORMAL HIGH (ref 0.1–1.0)
Monocytes Relative: 10 %
NEUTROS ABS: 7.7 10*3/uL (ref 1.7–7.7)
Neutrophils Relative %: 68 %
PLATELETS: 275 10*3/uL (ref 150–400)
RBC: 4.24 MIL/uL (ref 4.22–5.81)
RDW: 13.9 % (ref 11.5–15.5)
WBC: 11.3 10*3/uL — ABNORMAL HIGH (ref 4.0–10.5)

## 2018-01-13 LAB — PROTIME-INR
INR: 0.93
Prothrombin Time: 12.4 seconds (ref 11.4–15.2)

## 2018-01-13 LAB — COMPREHENSIVE METABOLIC PANEL
ALBUMIN: 3.7 g/dL (ref 3.5–5.0)
ALT: 17 U/L (ref 17–63)
AST: 20 U/L (ref 15–41)
Alkaline Phosphatase: 87 U/L (ref 38–126)
Anion gap: 10 (ref 5–15)
BILIRUBIN TOTAL: 1 mg/dL (ref 0.3–1.2)
BUN: 17 mg/dL (ref 6–20)
CHLORIDE: 98 mmol/L — AB (ref 101–111)
CO2: 29 mmol/L (ref 22–32)
Calcium: 9.4 mg/dL (ref 8.9–10.3)
Creatinine, Ser: 1 mg/dL (ref 0.61–1.24)
GFR calc Af Amer: >60 mL/min (ref >60)
GFR calc non Af Amer: >60 mL/min (ref >60)
Glucose, Bld: 103 mg/dL — ABNORMAL HIGH (ref 65–99)
POTASSIUM: 4.3 mmol/L (ref 3.5–5.1)
SODIUM: 137 mmol/L (ref 135–145)
TOTAL PROTEIN: 7.4 g/dL (ref 6.5–8.1)

## 2018-01-13 NOTE — Telephone Encounter (Signed)
I called [employer] for precert of surgery, they are closed for holiday today.

## 2018-01-15 NOTE — H&P (Signed)
Russell Luna is an 72 y.o. male.   Chief Complaint: Right foot open fracture right ankle open fracture with fourth and fifth digit necrosis HPI: 72 year old male had severe injury to his right lower extremity involving his right foot and ankle with open fractures on or about December 17 at which time he had open treatment internal fixation irrigation debridement followed by IV antibiotics in the hospital and then on the 20th had application of a sugar tong splint  During the postoperative period the fourth and fifth digit started to lose vascularity and color and seem to be auto necrosing.  He presents for amputation of said digits right foot  Past Medical History:  Diagnosis Date  . Arthritis   . High cholesterol   . Hypertension     Past Surgical History:  Procedure Laterality Date  . CAST APPLICATION Right 12/12/2017   Procedure: MINOR CAST APPLICATION RIGHT FOOT AND ANKLE;  Surgeon: Vickki Hearing, MD;  Location: [facility];  Service: Orthopedics;  Laterality: Right;  . ORIF ANKLE FRACTURE Right 12/09/2017   Procedure: OPEN REDUCTION INTERNAL FIXATION (ORIF) ANKLE FRACTURE;  Surgeon: Vickki Hearing, MD;  Location: [facility];  Service: Orthopedics;  Laterality: Right;  4pm    No family history on file. Social History:  reports that he has been smoking cigarettes.  He has a 50.00 pack-year smoking history. he has never used smokeless tobacco. He reports that he does not drink alcohol or use drugs.  Allergies: No Known Allergies  No medications prior to admission.    No results found for this or any previous visit (from the past 48 hour(s)). No results found.  Review of Systems  Constitutional: Negative for chills, fever and malaise/fatigue.  Musculoskeletal: Positive for joint pain.  Neurological: Positive for tingling and sensory change.    There were no vitals taken for this visit. Physical Exam  Constitutional: He is oriented to person, place, and time. He appears  well-developed and well-nourished. No distress.  HENT:  Head: Normocephalic and atraumatic.  Right Ear: External ear normal.  Left Ear: External ear normal.  Eyes: Conjunctivae and EOM are normal. Pupils are equal, round, and reactive to light. Right eye exhibits no discharge. Left eye exhibits no discharge. No scleral icterus.  Neck: Neck supple. No JVD present. No tracheal deviation present. No thyromegaly present.  Cardiovascular: Normal rate, regular rhythm and intact distal pulses.  Respiratory: No stridor. No respiratory distress. He has no wheezes. He exhibits no tenderness.  GI: He exhibits no distension and no mass. There is no tenderness.  Musculoskeletal:  Abnormal ambulation.  He is nonweightbearing now  His right foot the fourth and fifth digit appear to be losing vascularity have poor sensory  He also has wound drainage in the foot and ankle area.  There appears to be almost a burn type condition to his foot and lateral leg  His other extremities appear to be normal  Lymphadenopathy:    He has no cervical adenopathy.  Neurological: He is alert and oriented to person, place, and time. He has normal reflexes. He displays normal reflexes. No cranial nerve deficit. He exhibits normal muscle tone. Coordination normal.  Skin: He is not diaphoretic.  Psychiatric: He has a normal mood and affect. His behavior is normal. Judgment and thought content normal.     Assessment/Plan Open fractures fourth and fifth digit right foot open fracture right ankle multiple fractures right foot  Necrosis of the fourth and fifth digits of the right  foot  Presents for amputation of the fourth and fifth digit of the right foot  Fuller CanadaStanley Harrison, MD 01/15/2018, 5:11 PM

## 2018-01-16 ENCOUNTER — Inpatient Hospital Stay (HOSPITAL_COMMUNITY): Payer: Medicare Other | Admitting: Anesthesiology

## 2018-01-16 ENCOUNTER — Other Ambulatory Visit: Payer: Self-pay

## 2018-01-16 ENCOUNTER — Inpatient Hospital Stay (HOSPITAL_COMMUNITY)
Admission: RE | Admit: 2018-01-16 | Discharge: 2018-01-19 | DRG: 857 | Disposition: A | Discharge status: Home Health | Payer: Medicare Other | Source: Ambulatory Visit | Attending: Orthopedic Surgery | Admitting: Orthopedic Surgery

## 2018-01-16 ENCOUNTER — Encounter (HOSPITAL_COMMUNITY): Payer: Self-pay | Admitting: *Deleted

## 2018-01-16 ENCOUNTER — Encounter (HOSPITAL_COMMUNITY)
Admission: RE | Disposition: A | Discharge status: Home Health | Payer: Self-pay | Source: Ambulatory Visit | Attending: Orthopedic Surgery

## 2018-01-16 DIAGNOSIS — F17210 Nicotine dependence, cigarettes, uncomplicated: Secondary | ICD-10-CM | POA: Diagnosis present

## 2018-01-16 DIAGNOSIS — S92501P Displaced unspecified fracture of right lesser toe(s), subsequent encounter for fracture with malunion: Secondary | ICD-10-CM | POA: Diagnosis not present

## 2018-01-16 DIAGNOSIS — T8141XA Infection following a procedure, superficial incisional surgical site, initial encounter: Principal | ICD-10-CM | POA: Diagnosis present

## 2018-01-16 DIAGNOSIS — Y838 Other surgical procedures as the cause of abnormal reaction of the patient, or of later complication, without mention of misadventure at the time of the procedure: Secondary | ICD-10-CM | POA: Diagnosis present

## 2018-01-16 DIAGNOSIS — B9562 Methicillin resistant Staphylococcus aureus infection as the cause of diseases classified elsewhere: Secondary | ICD-10-CM | POA: Diagnosis present

## 2018-01-16 DIAGNOSIS — I10 Essential (primary) hypertension: Secondary | ICD-10-CM | POA: Diagnosis present

## 2018-01-16 DIAGNOSIS — S92514D Nondisplaced fracture of proximal phalanx of right lesser toe(s), subsequent encounter for fracture with routine healing: Secondary | ICD-10-CM

## 2018-01-16 DIAGNOSIS — S92511D Displaced fracture of proximal phalanx of right lesser toe(s), subsequent encounter for fracture with routine healing: Secondary | ICD-10-CM

## 2018-01-16 DIAGNOSIS — S92502P Displaced unspecified fracture of left lesser toe(s), subsequent encounter for fracture with malunion: Secondary | ICD-10-CM

## 2018-01-16 DIAGNOSIS — Z9889 Other specified postprocedural states: Secondary | ICD-10-CM

## 2018-01-16 DIAGNOSIS — Z79899 Other long term (current) drug therapy: Secondary | ICD-10-CM | POA: Diagnosis not present

## 2018-01-16 DIAGNOSIS — S92531B Displaced fracture of distal phalanx of right lesser toe(s), initial encounter for open fracture: Secondary | ICD-10-CM | POA: Diagnosis present

## 2018-01-16 DIAGNOSIS — S92501D Displaced unspecified fracture of right lesser toe(s), subsequent encounter for fracture with routine healing: Secondary | ICD-10-CM

## 2018-01-16 DIAGNOSIS — S82841E Displaced bimalleolar fracture of right lower leg, subsequent encounter for open fracture type I or II with routine healing: Secondary | ICD-10-CM

## 2018-01-16 DIAGNOSIS — E7800 Pure hypercholesterolemia, unspecified: Secondary | ICD-10-CM | POA: Diagnosis present

## 2018-01-16 DIAGNOSIS — I96 Gangrene, not elsewhere classified: Secondary | ICD-10-CM | POA: Diagnosis present

## 2018-01-16 DIAGNOSIS — S92502B Displaced unspecified fracture of left lesser toe(s), initial encounter for open fracture: Secondary | ICD-10-CM

## 2018-01-16 DIAGNOSIS — Z96651 Presence of right artificial knee joint: Secondary | ICD-10-CM

## 2018-01-16 DIAGNOSIS — Z8781 Personal history of (healed) traumatic fracture: Secondary | ICD-10-CM

## 2018-01-16 DIAGNOSIS — S92531D Displaced fracture of distal phalanx of right lesser toe(s), subsequent encounter for fracture with routine healing: Secondary | ICD-10-CM

## 2018-01-16 DIAGNOSIS — S92501B Displaced unspecified fracture of right lesser toe(s), initial encounter for open fracture: Secondary | ICD-10-CM

## 2018-01-16 HISTORY — PX: AMPUTATION TOE: SHX6595

## 2018-01-16 HISTORY — PX: INCISION AND DRAINAGE: SHX5863

## 2018-01-16 LAB — GLUCOSE, CAPILLARY
GLUCOSE-CAPILLARY: 107 mg/dL — AB (ref 65–99)
Glucose-Capillary: 117 mg/dL — ABNORMAL HIGH (ref 65–99)

## 2018-01-16 SURGERY — AMPUTATION, TOE
Anesthesia: Spinal | Laterality: Right

## 2018-01-16 MED ORDER — ONDANSETRON HCL 4 MG PO TABS: 4.0000 mg | ORAL_TABLET | Freq: Four times a day (QID) | ORAL | Status: DC | PRN

## 2018-01-16 MED ORDER — METOCLOPRAMIDE HCL 5 MG/ML IJ SOLN: 5.0000 mg | Freq: Three times a day (TID) | INTRAMUSCULAR | Status: DC | PRN

## 2018-01-16 MED ORDER — SUCCINYLCHOLINE CHLORIDE 20 MG/ML IJ SOLN
INTRAMUSCULAR | Status: AC
  Filled 2018-01-16: qty 1

## 2018-01-16 MED ORDER — HYDROCHLOROTHIAZIDE 25 MG PO TABS
25.0000 mg | ORAL_TABLET | Freq: Every day | ORAL | Status: DC
  Administered 2018-01-16 – 2018-01-19 (×4): 25 mg via ORAL
  Filled 2018-01-16 (×4): qty 1

## 2018-01-16 MED ORDER — LISINOPRIL 10 MG PO TABS
20.0000 mg | ORAL_TABLET | Freq: Every day | ORAL | Status: DC
  Administered 2018-01-16 – 2018-01-19 (×2): 20 mg via ORAL
  Filled 2018-01-16 (×3): qty 2

## 2018-01-16 MED ORDER — MORPHINE SULFATE 15 MG PO TABS
15.0000 mg | ORAL_TABLET | ORAL | Status: DC | PRN
  Administered 2018-01-16 – 2018-01-17 (×3): 15 mg via ORAL
  Filled 2018-01-16 (×3): qty 1

## 2018-01-16 MED ORDER — SODIUM CHLORIDE 0.9 % IJ SOLN
INTRAMUSCULAR | Status: AC
  Filled 2018-01-16: qty 10

## 2018-01-16 MED ORDER — ALUM & MAG HYDROXIDE-SIMETH 200-200-20 MG/5ML PO SUSP: 30.0000 mL | ORAL | Status: DC | PRN

## 2018-01-16 MED ORDER — POLYETHYLENE GLYCOL 3350 17 G PO PACK: 17.0000 g | PACK | Freq: Every day | ORAL | Status: DC | PRN

## 2018-01-16 MED ORDER — BUPIVACAINE-EPINEPHRINE (PF) 0.5% -1:200000 IJ SOLN
INTRAMUSCULAR | Status: AC
  Filled 2018-01-16: qty 30

## 2018-01-16 MED ORDER — ONDANSETRON HCL 4 MG/2ML IJ SOLN
INTRAMUSCULAR | Status: AC
  Filled 2018-01-16: qty 2

## 2018-01-16 MED ORDER — FENTANYL CITRATE (PF) 100 MCG/2ML IJ SOLN
INTRAMUSCULAR | Status: AC
  Filled 2018-01-16: qty 2

## 2018-01-16 MED ORDER — ONDANSETRON HCL 4 MG/2ML IJ SOLN: 4.0000 mg | Freq: Four times a day (QID) | INTRAMUSCULAR | Status: DC | PRN

## 2018-01-16 MED ORDER — EPHEDRINE SULFATE 50 MG/ML IJ SOLN
INTRAMUSCULAR | Status: DC | PRN
  Administered 2018-01-16: 5 mg via INTRAVENOUS
  Administered 2018-01-16: 10 mg via INTRAVENOUS
  Administered 2018-01-16 (×2): 5 mg via INTRAVENOUS

## 2018-01-16 MED ORDER — DOCUSATE SODIUM 100 MG PO CAPS
200.0000 mg | ORAL_CAPSULE | Freq: Two times a day (BID) | ORAL | Status: DC | PRN
  Administered 2018-01-19: 200 mg via ORAL
  Filled 2018-01-16: qty 2

## 2018-01-16 MED ORDER — HYDROMORPHONE HCL 1 MG/ML IJ SOLN
0.5000 mg | INTRAMUSCULAR | Status: DC | PRN
  Administered 2018-01-16 – 2018-01-19 (×8): 0.5 mg via INTRAVENOUS
  Filled 2018-01-16 (×8): qty 1

## 2018-01-16 MED ORDER — ATORVASTATIN CALCIUM 40 MG PO TABS
40.0000 mg | ORAL_TABLET | Freq: Every day | ORAL | Status: DC
  Administered 2018-01-16 – 2018-01-18 (×3): 40 mg via ORAL
  Filled 2018-01-16 (×3): qty 1

## 2018-01-16 MED ORDER — VANCOMYCIN HCL IN DEXTROSE 1-5 GM/200ML-% IV SOLN
1000.0000 mg | Freq: Two times a day (BID) | INTRAVENOUS | Status: AC
  Administered 2018-01-16 – 2018-01-18 (×4): 1000 mg via INTRAVENOUS
  Filled 2018-01-16 (×3): qty 200

## 2018-01-16 MED ORDER — BUPIVACAINE IN DEXTROSE 0.75-8.25 % IT SOLN
INTRATHECAL | Status: DC | PRN
Start: 2018-01-16 — End: 2018-01-16
  Administered 2018-01-16: 15 mg via INTRATHECAL

## 2018-01-16 MED ORDER — NICOTINE 21 MG/24HR TD PT24
21.0000 mg | MEDICATED_PATCH | Freq: Every day | TRANSDERMAL | Status: DC
  Administered 2018-01-16 – 2018-01-19 (×4): 21 mg via TRANSDERMAL
  Filled 2018-01-16 (×4): qty 1

## 2018-01-16 MED ORDER — SODIUM CHLORIDE 0.9 % IR SOLN
Status: DC | PRN
  Administered 2018-01-16 (×2): 1000 mL

## 2018-01-16 MED ORDER — CHLORHEXIDINE GLUCONATE 4 % EX LIQD: 60.0000 mL | Freq: Once | CUTANEOUS | Status: DC

## 2018-01-16 MED ORDER — MENTHOL 3 MG MT LOZG: 1.0000 | LOZENGE | OROMUCOSAL | Status: DC | PRN

## 2018-01-16 MED ORDER — MORPHINE SULFATE ER 30 MG PO TBCR
30.0000 mg | EXTENDED_RELEASE_TABLET | Freq: Two times a day (BID) | ORAL | Status: DC
  Administered 2018-01-16 – 2018-01-19 (×7): 30 mg via ORAL
  Filled 2018-01-16 (×7): qty 1

## 2018-01-16 MED ORDER — PROPOFOL 10 MG/ML IV BOLUS
INTRAVENOUS | Status: AC
  Filled 2018-01-16: qty 40

## 2018-01-16 MED ORDER — MIDAZOLAM HCL 5 MG/5ML IJ SOLN
INTRAMUSCULAR | Status: DC | PRN
  Administered 2018-01-16: 2 mg via INTRAVENOUS

## 2018-01-16 MED ORDER — PROPOFOL 500 MG/50ML IV EMUL
INTRAVENOUS | Status: DC | PRN
  Administered 2018-01-16: 50 ug/kg/min via INTRAVENOUS

## 2018-01-16 MED ORDER — PHENOL 1.4 % MT LIQD: 1.0000 | OROMUCOSAL | Status: DC | PRN

## 2018-01-16 MED ORDER — ACETAMINOPHEN 10 MG/ML IV SOLN
INTRAVENOUS | Status: AC
  Filled 2018-01-16: qty 100

## 2018-01-16 MED ORDER — ENOXAPARIN SODIUM 30 MG/0.3ML ~~LOC~~ SOLN
30.0000 mg | SUBCUTANEOUS | Status: DC
  Administered 2018-01-17 – 2018-01-19 (×3): 30 mg via SUBCUTANEOUS
  Filled 2018-01-16 (×3): qty 0.3

## 2018-01-16 MED ORDER — GABAPENTIN 100 MG PO CAPS
200.0000 mg | ORAL_CAPSULE | Freq: Three times a day (TID) | ORAL | Status: DC
  Administered 2018-01-16 – 2018-01-19 (×9): 200 mg via ORAL
  Filled 2018-01-16 (×9): qty 2

## 2018-01-16 MED ORDER — PHENYLEPHRINE HCL 10 MG/ML IJ SOLN
INTRAMUSCULAR | Status: DC | PRN
  Administered 2018-01-16: 40 ug via INTRAVENOUS
  Administered 2018-01-16: 80 ug via INTRAVENOUS
  Administered 2018-01-16: 40 ug via INTRAVENOUS

## 2018-01-16 MED ORDER — LACTATED RINGERS IV SOLN
INTRAVENOUS | Status: DC
  Administered 2018-01-16: 09:00:00 via INTRAVENOUS

## 2018-01-16 MED ORDER — METOCLOPRAMIDE HCL 10 MG PO TABS: 5.0000 mg | ORAL_TABLET | Freq: Three times a day (TID) | ORAL | Status: DC | PRN

## 2018-01-16 MED ORDER — ROCURONIUM BROMIDE 50 MG/5ML IV SOLN
INTRAVENOUS | Status: AC
  Filled 2018-01-16: qty 1

## 2018-01-16 MED ORDER — FENTANYL CITRATE (PF) 250 MCG/5ML IJ SOLN
INTRAMUSCULAR | Status: AC
  Filled 2018-01-16: qty 5

## 2018-01-16 MED ORDER — EPHEDRINE SULFATE 50 MG/ML IJ SOLN
INTRAMUSCULAR | Status: AC
Start: 2018-01-16 — End: ?
  Filled 2018-01-16: qty 1

## 2018-01-16 MED ORDER — SODIUM CHLORIDE 0.9 % IV SOLN
INTRAVENOUS | Status: DC
  Administered 2018-01-16: 13:00:00 via INTRAVENOUS

## 2018-01-16 MED ORDER — MIDAZOLAM HCL 2 MG/2ML IJ SOLN
INTRAMUSCULAR | Status: AC
  Filled 2018-01-16: qty 2

## 2018-01-16 MED ORDER — FENTANYL CITRATE (PF) 100 MCG/2ML IJ SOLN
INTRAMUSCULAR | Status: DC | PRN
  Administered 2018-01-16: 25 ug via INTRATHECAL

## 2018-01-16 MED ORDER — FENTANYL CITRATE (PF) 100 MCG/2ML IJ SOLN: 25.0000 ug | INTRAMUSCULAR | Status: DC | PRN

## 2018-01-16 MED ORDER — ACETAMINOPHEN 10 MG/ML IV SOLN
1000.0000 mg | Freq: Four times a day (QID) | INTRAVENOUS | Status: AC
  Administered 2018-01-16 – 2018-01-17 (×4): 1000 mg via INTRAVENOUS
  Filled 2018-01-16 (×6): qty 100

## 2018-01-16 MED ORDER — CIPROFLOXACIN HCL 250 MG PO TABS
500.0000 mg | ORAL_TABLET | Freq: Two times a day (BID) | ORAL | Status: AC
  Administered 2018-01-16 – 2018-01-18 (×6): 500 mg via ORAL
  Filled 2018-01-16 (×6): qty 2

## 2018-01-16 MED ORDER — POLYETHYLENE GLYCOL 3350 17 GM/SCOOP PO POWD
17.0000 g | Freq: Every day | ORAL | Status: DC | PRN
  Filled 2018-01-16: qty 255

## 2018-01-16 SURGICAL SUPPLY — 34 items
BAG HAMPER (MISCELLANEOUS) ×3 IMPLANT
BANDAGE ELASTIC 4 LF NS (GAUZE/BANDAGES/DRESSINGS) ×9 IMPLANT
BANDAGE ESMARK 4X12 BL STRL LF (DISPOSABLE) ×1 IMPLANT
BNDG COHESIVE 4X5 TAN STRL (GAUZE/BANDAGES/DRESSINGS) ×3 IMPLANT
BNDG ESMARK 4X12 BLUE STRL LF (DISPOSABLE) ×3
BNDG GAUZE ELAST 4 BULKY (GAUZE/BANDAGES/DRESSINGS) ×3 IMPLANT
CLOTH BEACON ORANGE TIMEOUT ST (SAFETY) ×3 IMPLANT
COVER LIGHT HANDLE STERIS (MISCELLANEOUS) ×6 IMPLANT
CUFF TOURNIQUET SINGLE 34IN LL (TOURNIQUET CUFF) ×3 IMPLANT
ELECT REM PT RETURN 9FT ADLT (ELECTROSURGICAL) ×3
ELECTRODE REM PT RTRN 9FT ADLT (ELECTROSURGICAL) ×1 IMPLANT
GAUZE IODOFORM PACK 1/2 7832 (GAUZE/BANDAGES/DRESSINGS) ×3 IMPLANT
GAUZE SPONGE 4X4 12PLY STRL (GAUZE/BANDAGES/DRESSINGS) ×6 IMPLANT
GAUZE XEROFORM 5X9 LF (GAUZE/BANDAGES/DRESSINGS) ×3 IMPLANT
GLOVE BIO SURGEON STRL SZ7 (GLOVE) ×3 IMPLANT
GLOVE BIOGEL PI IND STRL 7.0 (GLOVE) ×4 IMPLANT
GLOVE BIOGEL PI INDICATOR 7.0 (GLOVE) ×8
GLOVE ECLIPSE 6.5 STRL STRAW (GLOVE) ×3 IMPLANT
GLOVE SKINSENSE NS SZ8.0 LF (GLOVE) ×2
GLOVE SKINSENSE STRL SZ8.0 LF (GLOVE) ×1 IMPLANT
GLOVE SS N UNI LF 8.5 STRL (GLOVE) ×3 IMPLANT
GOWN STRL REUS W/TWL LRG LVL3 (GOWN DISPOSABLE) ×6 IMPLANT
GOWN STRL REUS W/TWL XL LVL3 (GOWN DISPOSABLE) ×3 IMPLANT
KIT ROOM TURNOVER APOR (KITS) ×3 IMPLANT
MANIFOLD NEPTUNE II (INSTRUMENTS) ×3 IMPLANT
NS IRRIG 1000ML POUR BTL (IV SOLUTION) ×6 IMPLANT
PACK BASIC LIMB (CUSTOM PROCEDURE TRAY) ×3 IMPLANT
PAD ABD 5X9 TENDERSORB (GAUZE/BANDAGES/DRESSINGS) ×6 IMPLANT
PAD ARMBOARD 7.5X6 YLW CONV (MISCELLANEOUS) ×3 IMPLANT
SET BASIN LINEN APH (SET/KITS/TRAYS/PACK) ×3 IMPLANT
SPONGE LAP 18X18 X RAY DECT (DISPOSABLE) ×3 IMPLANT
SWAB CULTURE ESWAB REG 1ML (MISCELLANEOUS) ×3 IMPLANT
SWAB CULTURE LIQ STUART DBL (MISCELLANEOUS) ×3 IMPLANT
SYR BULB IRRIGATION 50ML (SYRINGE) ×3 IMPLANT

## 2018-01-16 NOTE — Anesthesia Procedure Notes (Signed)
Procedure Name: MAC Date/Time: 01/16/2018 9:50 AM Performed by: Andree Elk Brandyce Dimario A, CRNA Pre-anesthesia Checklist: Patient identified, Timeout performed, Emergency Drugs available, Suction available and Patient being monitored Patient Re-evaluated:Patient Re-evaluated prior to induction Oxygen Delivery Method: Simple face mask

## 2018-01-16 NOTE — Anesthesia Postprocedure Evaluation (Signed)
Anesthesia Post Note  Patient: Charlestine Massedhomas H Hayman  Procedure(s) Performed: AMPUTATION FOURTH AND FIFTH TOES RIGHT FOOT (Right ) INCISION AND DRAINAGE RIGHT FOOT AND ANKLE (Right )  Patient location during evaluation: PACU Anesthesia Type: Spinal Level of consciousness: awake and alert and oriented Pain management: pain level controlled Vital Signs Assessment: post-procedure vital signs reviewed and stable Respiratory status: spontaneous breathing and respiratory function stable Cardiovascular status: stable Postop Assessment: no apparent nausea or vomiting Anesthetic complications: no     Last Vitals:  Vitals:   01/16/18 1145 01/16/18 1200  BP: 98/61 (!) 106/53  Pulse: 75 74  Resp: 12 10  Temp:    SpO2: 94% 95%    Last Pain: There were no vitals filed for this visit.    LLE Sensation: No pain;Tingling (01/16/18 1200)   RLE Sensation: No pain;Tingling (01/16/18 1200) L Sensory Level: L3-Anterior knee, lower leg (01/16/18 1200) R Sensory Level: L3-Anterior knee, lower leg (01/16/18 1200)  ADAMS, AMY A

## 2018-01-16 NOTE — Anesthesia Procedure Notes (Signed)
Spinal  Patient location during procedure: OR Start time: 01/16/2018 10:08 AM Staffing Resident/CRNA: Earleen NewportAdams, Darrien Laakso A, CRNA Preanesthetic Checklist Completed: patient identified, site marked, surgical consent, pre-op evaluation, timeout performed, IV checked, risks and benefits discussed and monitors and equipment checked Spinal Block Patient position: right lateral decubitus Prep: Betadine Patient monitoring: heart rate, cardiac monitor, continuous pulse ox and blood pressure Approach: right paramedian Location: L3-4 Injection technique: single-shot Needle Needle type: Spinocan  Needle gauge: 22 G Needle length: 9 cm Assessment Sensory level: T8 Additional Notes ATTEMPTS:1 TRAY ZO:[phone] TRAY EXPIRATION DATE:02/21/2020 Patient tolerated procedure well.

## 2018-01-16 NOTE — Transfer of Care (Signed)
Immediate Anesthesia Transfer of Care Note  Patient: Charlestine Massedhomas H Dung  Procedure(s) Performed: AMPUTATION FOURTH AND FIFTH TOES RIGHT FOOT (Right ) INCISION AND DRAINAGE RIGHT FOOT AND ANKLE (Right )  Patient Location: PACU  Anesthesia Type:Spinal  Level of Consciousness: awake, alert , oriented and patient cooperative  Airway & Oxygen Therapy: Patient Spontanous Breathing and Patient connected to nasal cannula oxygen  Post-op Assessment: Report given to RN and Post -op Vital signs reviewed and stable  Post vital signs: Reviewed and stable  Last Vitals: There were no vitals filed for this visit.  Last Pain: There were no vitals filed for this visit.       Complications: No apparent anesthesia complications

## 2018-01-16 NOTE — Interval H&P Note (Signed)
History and Physical Interval Note:  01/16/2018 9:45 AM  Russell Luna  has presented today for surgery, with the diagnosis of Ischemia Right Fourth and Fifth Toes; Open Fracture Right Foot and Ankle  The various methods of treatment have been discussed with the patient and family. After consideration of risks, benefits and other options for treatment, the patient has consented to  Procedure(s): AMPUTATION FOURTH AND FIFTH TOES RIGHT FOOT (Right) INCISION AND DRAINAGE RIGHT FOOT AND ANKLE (Right) as a surgical intervention .  The patient's history has been reviewed, patient examined, no change in status, stable for surgery.  I have reviewed the patient's chart and labs.  Questions were answered to the patient's satisfaction.     Fuller CanadaStanley Harrison

## 2018-01-16 NOTE — Op Note (Signed)
01/16/2018  11:09 AM  PATIENT:  Russell Luna  72 y.o. male  PRE-OPERATIVE DIAGNOSIS:  Ischemia Right Fourth and Fifth Toes; Open Fracture Right Foot and Ankle  POST-OPERATIVE DIAGNOSIS:  Ischemia Right Fourth and Fifth Toes; Open Fracture Right Foot and Ankle  PROCEDURE:  Procedure(s): AMPUTATION FOURTH AND FIFTH TOES RIGHT FOOT (Right) INCISION AND DRAINAGE RIGHT FOOT AND ANKLE (Right)   Surgical findings were  Necrosis of the fourth and fifth digits of the right foot involving the toes with eschar dorsum of the foot laterally lateral portion of the foot up to the lateral malleolus  No obvious drainage  No signs of infection of the ankle fixation or wound problems layer  The details of the procedure were  The surgical site was confirmed and marked in the preop area right foot Surgical update was completed Patient was taken to the operating room for spinal anesthesia then placed in the supine position with a bump under the right hip where Betadine prep and followed by sterile draping was performed  Sharp dissection was used to debride the eschar around the foot and ankle.  There was good tissue without exposed bone under the eschar dorsally laterally and up to the lateral malleolus  The toes were completely necrotic black and nonviable and they were resected.  I resected the fourth digit at the metatarsophalangeal joint and the fifth digit just distal to the proximal portion of the proximal phalanx  Thorough irrigation was performed  The distal portion of the wound was packed with iodoform gauze and sterile dressings were applied along with a splint  Cultures will be identified in appropriate specific antibiotics will be converted from broad-spectrum antibiotic   SURGEON:  Surgeon(s) and Role:    * Vickki HearingHarrison, Zephyr Sausedo E, MD - Primary  PHYSICIAN ASSISTANT:   ASSISTANTS: Concordia NationBetty Ashley   ANESTHESIA:   spinal  EBL:  5 mL   BLOOD ADMINISTERED:none  DRAINS: none    LOCAL MEDICATIONS USED:  NONE  SPECIMEN: 2 toes the fourth and the fifth and cultures  DISPOSITION OF SPECIMEN:  N/A  COUNTS:  YES  TOURNIQUET:  * No tourniquets in log *  DICTATION: .Dragon Dictation  PLAN OF CARE: Admit to inpatient   PATIENT DISPOSITION:  PACU - hemodynamically stable.   Delay start of Pharmacological VTE agent (>24hrs) due to surgical blood loss or risk of bleeding: yes

## 2018-01-16 NOTE — Anesthesia Preprocedure Evaluation (Signed)
Anesthesia Evaluation  Patient identified by MRN, date of birth, ID band Patient awake    Reviewed: Allergy & Precautions, NPO status , Patient's Chart, lab work & pertinent test results  Airway Mallampati: III  TM Distance: >3 FB Neck ROM: Limited  Mouth opening: Limited Mouth Opening  Dental  (+) Edentulous Upper, Edentulous Lower   Pulmonary Current Smoker,    breath sounds clear to auscultation       Cardiovascular hypertension, Pt. on medications  Rate:Normal     Neuro/Psych    GI/Hepatic negative GI ROS, Neg liver ROS,   Endo/Other  negative endocrine ROS  Renal/GU negative Renal ROS     Musculoskeletal  (+) Arthritis ,   Abdominal   Peds  Hematology negative hematology ROS (+)   Anesthesia Other Findings   Reproductive/Obstetrics                             Anesthesia Physical Anesthesia Plan  ASA: II  Anesthesia Plan: Spinal   Post-op Pain Management:    Induction:   PONV Risk Score and Plan:   Airway Management Planned: Simple Face Mask  Additional Equipment:   Intra-op Plan:   Post-operative Plan:   Informed Consent: I have reviewed the patients History and Physical, chart, labs and discussed the procedure including the risks, benefits and alternatives for the proposed anesthesia with the patient or authorized representative who has indicated his/her understanding and acceptance.   Dental advisory given  Plan Discussed with: Anesthesiologist and Surgeon  Anesthesia Plan Comments:         Anesthesia Quick Evaluation

## 2018-01-16 NOTE — Brief Op Note (Signed)
01/16/2018  11:09 AM  PATIENT:  Russell Luna  72 y.o. male  PRE-OPERATIVE DIAGNOSIS:  Ischemia Right Fourth and Fifth Toes; Open Fracture Right Foot and Ankle  POST-OPERATIVE DIAGNOSIS:  Ischemia Right Fourth and Fifth Toes; Open Fracture Right Foot and Ankle  PROCEDURE:  Procedure(s): AMPUTATION FOURTH AND FIFTH TOES RIGHT FOOT (Right) INCISION AND DRAINAGE RIGHT FOOT AND ANKLE (Right)   Surgical findings were  Necrosis of the fourth and fifth digits of the right foot involving the toes with eschar dorsum of the foot laterally lateral portion of the foot up to the lateral malleolus  No obvious drainage  No signs of infection of the ankle fixation or wound problems layer  The details of the procedure were  The surgical site was confirmed and marked in the preop area right foot Surgical update was completed Patient was taken to the operating room for spinal anesthesia then placed in the supine position with a bump under the right hip where Betadine prep and followed by sterile draping was performed  Sharp dissection was used to debride the eschar around the foot and ankle.  There was good tissue without exposed bone under the eschar dorsally laterally and up to the lateral malleolus  The toes were completely necrotic black and nonviable and they were resected.  I resected the fourth digit at the metatarsophalangeal joint and the fifth digit just distal to the proximal portion of the proximal phalanx  Thorough irrigation was performed  The distal portion of the wound was packed with iodoform gauze and sterile dressings were applied along with a splint  Cultures will be identified in appropriate specific antibiotics will be converted from broad-spectrum antibiotic   SURGEON:  Surgeon(s) and Role:    * Sicilia Killough E, MD - Primary  PHYSICIAN ASSISTANT:   ASSISTANTS: Betty Ashley   ANESTHESIA:   spinal  EBL:  5 mL   BLOOD ADMINISTERED:none  DRAINS: none    LOCAL MEDICATIONS USED:  NONE  SPECIMEN: 2 toes the fourth and the fifth and cultures  DISPOSITION OF SPECIMEN:  N/A  COUNTS:  YES  TOURNIQUET:  * No tourniquets in log *  DICTATION: .Dragon Dictation  PLAN OF CARE: Admit to inpatient   PATIENT DISPOSITION:  PACU - hemodynamically stable.   Delay start of Pharmacological VTE agent (>24hrs) due to surgical blood loss or risk of bleeding: yes  

## 2018-01-17 LAB — CBC
HCT: 38.3 % — ABNORMAL LOW (ref 39.0–52.0)
HEMOGLOBIN: 11.8 g/dL — AB (ref 13.0–17.0)
MCH: 30.1 pg (ref 26.0–34.0)
MCHC: 30.8 g/dL (ref 30.0–36.0)
MCV: 97.7 fL (ref 78.0–100.0)
PLATELETS: 303 10*3/uL (ref 150–400)
RBC: 3.92 MIL/uL — AB (ref 4.22–5.81)
RDW: 13.8 % (ref 11.5–15.5)
WBC: 10.7 10*3/uL — AB (ref 4.0–10.5)

## 2018-01-17 LAB — BASIC METABOLIC PANEL
ANION GAP: 8 (ref 5–15)
BUN: 15 mg/dL (ref 6–20)
CHLORIDE: 99 mmol/L — AB (ref 101–111)
CO2: 32 mmol/L (ref 22–32)
Calcium: 9.3 mg/dL (ref 8.9–10.3)
Creatinine, Ser: 0.89 mg/dL (ref 0.61–1.24)
Glucose, Bld: 101 mg/dL — ABNORMAL HIGH (ref 65–99)
POTASSIUM: 4.4 mmol/L (ref 3.5–5.1)
SODIUM: 139 mmol/L (ref 135–145)

## 2018-01-17 NOTE — Evaluation (Signed)
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  Patient tolerated sitting up in chair with BLE elevated, but c/o severe pain in right foot/ankle, ice applied and RN notified for pain medication.  Patient will benefit from continued physical therapy in hospital and recommended venue below to increase strength, balance, endurance for safe ADLs and gait.    Follow Up Recommendations Home health PT;Supervision for mobility/OOB    Equipment Recommendations  None recommended by PT    Recommendations for Other Services       Precautions / Restrictions Precautions Precautions: Fall Restrictions Weight Bearing Restrictions: Yes RLE Weight Bearing: Non weight bearing      Mobility  Bed Mobility Overal bed mobility: Modified Independent             General bed mobility comments: Defer to PT note  Transfers Overall transfer level: Needs assistance Equipment used: Rolling walker (2 wheeled) Transfers: Sit to/from [employer]GI CorporationStand;Stand Pivot Transfers Sit to Stand: Supervision Stand pivot transfers: Supervision       General transfer comment: Defer to PT note  Ambulation/Gait Ambulation/Gait assistance: Supervision Ambulation Distance (Feet): 25 Feet Assistive device: Rolling walker (2 wheeled) Gait Pattern/deviations: Step-to pattern   Gait velocity interpretation: Below normal speed for age/gender General Gait Details: demonstrates good return for maintaining NWB RLE using RW without loss of balance, limited secondary to c/o increasing RLE pain while in dependent  position  Stairs            Wheelchair Mobility    Modified Rankin (Stroke Patients Only)       Balance Overall balance assessment: Needs assistance Sitting-balance support: No upper extremity supported;Feet supported Sitting balance-Leahy Scale: Good     Standing balance support: Bilateral upper extremity supported;During functional activity Standing balance-Leahy Scale: Fair Standing balance comment: fair/good with RW                             Pertinent Vitals/Pain Pain Assessment: 0-10 Pain Score: 7  Pain Location: right foot Pain Descriptors / Indicators: Aching;Grimacing;Sharp;Shooting;Sore Pain Intervention(s): Limited activity within patient's tolerance;Monitored during session;Patient requesting pain meds-RN notified;Ice applied    Home Living Family/patient expects to be discharged to:: Private residence Living Arrangements: Spouse/significant other Available Help at Discharge: Family;Available 24 hours/day Type of Home: House Home Access: Stairs to enter;Ramped entrance Entrance Stairs-Rails: Right;Left Entrance Stairs-Number of Steps: 3 steps to porch with handrail; small step through door no handrail Home Layout: Able to live on main level with bedroom/bathroom Home Equipment: Krystal Clarkane - quad;Walker - standard;Electric scooter;Shower seat - built in;Bedside commode      Prior Function Level of Independence: Needs assistance   Gait / Transfers Assistance Needed: using walker for functional mobility due to NWB status  ADL's / Homemaking Assistance Needed: Pt's wife assisting with ADL completion-mostly LB ADLs        Hand Dominance   Dominant Hand: Right    Extremity/Trunk Assessment   Upper Extremity Assessment Upper Extremity Assessment: Defer to OT evaluation    Lower Extremity Assessment Lower Extremity Assessment: Overall [facility]  for tasks assessed;RLE deficits/detail RLE Deficits / Details: grossly -3/5 mostly due to severe right  foot pain    Cervical / Trunk Assessment Cervical / Trunk Assessment: Normal  Communication   Communication: HOH  Cognition Arousal/Alertness: Awake/alert Behavior During Therapy: WFL for tasks assessed/performed Overall Cognitive Status: Within Functional Limits for tasks assessed                                        General Comments      Exercises     Assessment/Plan    PT Assessment Patient needs continued PT services  PT Problem List Decreased strength;Decreased activity tolerance;Decreased balance;Decreased mobility;Decreased range of motion;Pain       PT Treatment Interventions Gait training;Functional mobility training;Therapeutic activities;Therapeutic exercise;Patient/family education    PT Goals (Current goals can be found in the Care Plan section)  Acute Rehab PT Goals Patient Stated Goal: return home PT Goal Formulation: With patient Time For Goal Achievement: 01/20/18 Potential to Achieve Goals: Good    Frequency Min 3X/week   Barriers to discharge        Co-evaluation   Reason for Co-Treatment: Complexity of the patient's impairments (multi-system involvement);To address functional/ADL transfers   OT goals addressed during session: ADL's and self-care       AM-PAC PT "6 Clicks" Daily Activity  Outcome Measure Difficulty turning over in bed (including adjusting bedclothes, sheets and blankets)?: None Difficulty moving from lying on back to sitting on the side of the bed? : None Difficulty sitting down on and standing up from a chair with arms (e.g., wheelchair, bedside commode, etc,.)?: A Little Help needed moving to and from a bed to chair (including a wheelchair)?: A Little Help needed walking in hospital room?: A Little Help needed climbing 3-5 steps with a railing? : A Lot 6 Click Score: 19    End of Session   Activity Tolerance: Patient tolerated treatment well;Patient limited by pain Patient left: in chair;with call  bell/phone within reach(with BLE elevated) Nurse Communication: Mobility status PT Visit Diagnosis: Unsteadiness on feet (R26.81);Other abnormalities of gait and mobility (R26.89);Muscle weakness (generalized) (M62.81)    Time: 4098-1191 PT Time Calculation (min) (ACUTE ONLY): 24 min   Charges:   PT Evaluation $PT Eval Low Complexity: 1 Low PT Treatments $Therapeutic Activity: 23-37 mins   PT G Codes:        2:12 PM, 02/05/2018 Ocie Bob, MPT Physical Therapist with [facility] [phone] office [phone] mobile phone

## 2018-01-17 NOTE — Anesthesia Postprocedure Evaluation (Signed)
Anesthesia Post Note  Patient: Russell Luna  Procedure(s) Performed: AMPUTATION FOURTH AND FIFTH TOES RIGHT FOOT (Right ) INCISION AND DRAINAGE RIGHT FOOT AND ANKLE (Right )  Patient location during evaluation: Nursing Unit Anesthesia Type: Spinal Level of consciousness: awake and alert and oriented Pain management: pain level controlled Vital Signs Assessment: post-procedure vital signs reviewed and stable Respiratory status: spontaneous breathing Cardiovascular status: blood pressure returned to baseline and stable Postop Assessment: no apparent nausea or vomiting and adequate PO intake Anesthetic complications: no     Last Vitals:  Vitals:   01/17/18 0100 01/17/18 0500  BP: (!) 93/46 98/62  Pulse: 79 77  Resp: 16 16  Temp: 36.9 C 36.5 C  SpO2: 93% 95%    Last Pain:  Vitals:   01/17/18 0933  TempSrc:   PainSc: 10-Worst pain ever                 [facility] HospitalDANIEL,Sarh Kirschenbaum

## 2018-01-17 NOTE — Addendum Note (Signed)
Addendum  created 01/17/18 0951 by Wake Conlee E, CRNA   Sign clinical note    

## 2018-01-17 NOTE — Care Management Important Message (Signed)
Important Message  Patient Details  Name: Russell Luna MRN: [id number] Date of Birth: 18-Jan-1946   Medicare Important Message Given:  Yes    Aarin Sparkman, Chrystine OilerSharley Diane, RN 01/17/2018, 2:39 PM

## 2018-01-17 NOTE — Care Management Note (Signed)
Case Management Note  Patient Details  Name: Russell Luna MRN: [id number] Date of Birth: 1946/02/11  Subjective/Objective:   S/p amputation fourth and fifth toes right foot. From home with wife. [facility] Patient agreeable. Patient is renting a 3 wheel scooter. Has a walker, WC, BSC, toilet riser and knee walker. Son has also built a ramp to get into house.               Action/Plan: DC home with resumption of HH PT and ? [facility], CM will follow. Tim of [facility] aware of patient, will notify Jorja Loaim of DC date.    Expected Discharge Date:    unk at this time.               Expected Discharge Plan:  Home w Home Health Services  In-House Referral:     Discharge planning Services  CM Consult  Post Acute Care Choice:  Home Health, Resumption of Svcs/PTA Provider Choice offered to:  Patient  DME Arranged:    DME Agency:     HH Arranged:    [facility]  Status of Service:  In process, will continue to follow  If discussed at Long Length of Stay Meetings, dates discussed:    Additional Comments:  Shantrell Placzek, Chrystine OilerSharley Diane, RN 01/17/2018, 2:33 PM

## 2018-01-17 NOTE — Progress Notes (Signed)
Subjective: 1 Day Post-Op Procedure(s) (LRB): AMPUTATION FOURTH AND FIFTH TOES RIGHT FOOT (Right) INCISION AND DRAINAGE RIGHT FOOT AND ANKLE (Right) Patient reports pain as severe.    Objective: Vital signs in last 24 hours: Temp:  [97.7 F (36.5 C)-98.6 F (37 C)] 97.7 F (36.5 C) (01/25 0500) Pulse Rate:  [71-92] 77 (01/25 0500) Resp:  [10-20] 16 (01/25 0500) BP: (90-119)/(46-80) 98/62 (01/25 0500) SpO2:  [93 %-99 %] 95 % (01/25 0500) Weight:  [225 lb 12 oz (102.4 kg)] 225 lb 12 oz (102.4 kg) (01/24 1336)  Intake/Output from previous day: 01/24 0701 - 01/25 0700 In: 1623.3 [P.O.:360; I.V.:1063.3; IV Piggyback:200] Out: 2880 [Urine:2875; Blood:5] Intake/Output this shift: No intake/output data recorded.  Recent Labs    01/17/18 0514  HGB 11.8*   Recent Labs    01/17/18 0514  WBC 10.7*  RBC 3.92*  HCT 38.3*  PLT 303   Recent Labs    01/17/18 0514  NA 139  K 4.4  CL 99*  CO2 32  BUN 15  CREATININE 0.89  GLUCOSE 101*  CALCIUM 9.3   No results for input(s): LABPT, INR in the last 72 hours.  Neurologically intact ABD soft Neurovascular intact Sensation intact distally Intact pulses distally Dorsiflexion/Plantar flexion intact Incision: moderate drainage  Assessment/Plan: 1 Day Post-Op Procedure(s) (LRB): AMPUTATION FOURTH AND FIFTH TOES RIGHT FOOT (Right) INCISION AND DRAINAGE RIGHT FOOT AND ANKLE (Right) Advance diet Up with therapy D/C IV fluids Continue ABX therapy due to Post-op infection Discharge home with home health  Fuller CanadaStanley Quintasia Theroux 01/17/2018, 7:48 AM

## 2018-01-17 NOTE — Evaluation (Signed)
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 Pt demonstrates good control of RLE and maintaining NWB status during transfers and functional mobility. Pt is requiring increased level of assistance during ADLs due to pain and NWB status limiting independence. Pt reports his wife is available to assist with ADLs as needed, no further OT services required at this time.     Follow Up Recommendations  No OT follow up;Supervision/Assistance - 24 hour    Equipment Recommendations  Tub/shower seat       Precautions / Restrictions Precautions Precautions: Fall Restrictions Weight Bearing Restrictions: Yes RLE Weight Bearing: Non weight bearing      Mobility Bed Mobility               General bed mobility comments: Defer to PT note  Transfers                 General transfer comment: Defer to PT note        ADL either performed or assessed with clinical judgement   ADL Overall ADL's : Needs assistance/impaired                                       General ADL Comments: Pt requiring increase level of assistance due to pain in RLE and NWB status. Pt able to complete seated tasks with set-up, requires assistance for LB ADL completin.      Vision Baseline Vision/History: No visual deficits Patient Visual Report: No change from baseline Vision Assessment?: No apparent visual deficits            Pertinent Vitals/Pain Pain Assessment: 0-10 Pain Score: 8  Pain Location: right foot Pain Descriptors / Indicators: Aching;Grimacing;Sharp;Shooting;Sore Pain Intervention(s): Limited activity within patient's tolerance;Monitored during session;Repositioned;Patient requesting pain meds-RN notified     Hand Dominance  Right   Extremity/Trunk Assessment Upper Extremity Assessment Upper Extremity Assessment: Overall WFL for tasks assessed   Lower Extremity Assessment Lower Extremity Assessment: Defer to PT evaluation   Cervical / Trunk Assessment Cervical / Trunk Assessment: Normal   Communication Communication Communication: HOH(hearing aids)   Cognition Arousal/Alertness: Awake/alert Behavior During Therapy: WFL for tasks assessed/performed Overall Cognitive Status: Within Functional Limits for tasks assessed                                                Home Living Family/patient expects to be discharged to:: Private residence Living Arrangements: Spouse/significant other Available Help at Discharge: Family;Available 24 hours/day Type of Home: House Home Access: Stairs to enter [employer] of Steps: 3 steps to porch with handrail; small step through door no handrail Entrance Stairs-Rails: Right;Left;Can reach both Home Layout: Able to live on main level with bedroom/bathroom     Bathroom Shower/Tub: Chief Strategy Officer: Standard     Home Equipment: Cane - quad;Walker - standard          Prior Functioning/Environment Level of Independence: Needs assistance  Gait / Transfers Assistance Needed: using walker for functional mobility due to NWB status ADL's / Homemaking Assistance Needed:  Pt's wife assisting with ADL completion-mostly LB ADLs            OT Problem List: Decreased activity tolerance;Impaired balance (sitting and/or standing);Pain                       Co-evaluation PT/OT/SLP Co-Evaluation/Treatment: Yes Reason for Co-Treatment: Complexity of the patient's impairments (multi-system involvement);To address functional/ADL transfers   OT goals addressed during session: ADL's and self-care         End of Session Equipment Utilized During Treatment: Gait belt;Rolling walker Nurse Communication: Patient requests  pain meds  Activity Tolerance: Patient tolerated treatment well Patient left: in chair;with call bell/phone within reach  OT Visit Diagnosis: Muscle weakness (generalized) (M62.81);Pain Pain - Right/Left: Right Pain - part of body: Ankle and joints of foot                Time: [id number] OT Time Calculation (min): 20 min Charges:  OT General Charges $OT Visit: 1 Visit OT Evaluation $OT Eval Low Complexity: 1 Low   Ezra SitesLeslie Tiasha Helvie, OTR/L  [phone] 01/17/2018, 12:08 PM

## 2018-01-17 NOTE — Plan of Care (Signed)
  Acute Rehab PT Goals(only PT should resolve) Patient Will Transfer Sit To/From Stand 01/17/2018 1412 - Progressing by Ocie BobWatkins, Russell Luna, PT Flowsheets Taken 01/17/2018 1412  Patient will transfer sit to/from stand with modified independence Pt Will Transfer Bed To Chair/Chair To Bed 01/17/2018 1412 - Progressing by Ocie BobWatkins, Russell Luna, PT Flowsheets Taken 01/17/2018 1412  Pt will Transfer Bed to Chair/Chair to Bed with modified independence Pt Will Ambulate 01/17/2018 1412 - Progressing by Ocie BobWatkins, Russell Luna, PT Flowsheets Taken 01/17/2018 1412  Pt will Ambulate with modified independence;50 feet;with rolling walker Note While maintaining NWB RLE  2:13 PM, 01/17/18 Ocie BobJames Perla Luna, MPT Physical Therapist [facility] [phone] office [phone] mobile phone

## 2018-01-18 LAB — BASIC METABOLIC PANEL
Anion gap: 11 (ref 5–15)
BUN: 14 mg/dL (ref 6–20)
CALCIUM: 9.5 mg/dL (ref 8.9–10.3)
CO2: 30 mmol/L (ref 22–32)
CREATININE: 0.87 mg/dL (ref 0.61–1.24)
Chloride: 100 mmol/L — ABNORMAL LOW (ref 101–111)
GFR calc non Af Amer: >60 mL/min (ref >60)
Glucose, Bld: 93 mg/dL (ref 65–99)
Potassium: 4 mmol/L (ref 3.5–5.1)
Sodium: 141 mmol/L (ref 135–145)

## 2018-01-18 LAB — CBC
HCT: 40.2 % (ref 39.0–52.0)
HEMOGLOBIN: 12.7 g/dL — AB (ref 13.0–17.0)
MCH: 30.5 pg (ref 26.0–34.0)
MCHC: 31.6 g/dL (ref 30.0–36.0)
MCV: 96.6 fL (ref 78.0–100.0)
PLATELETS: 310 10*3/uL (ref 150–400)
RBC: 4.16 MIL/uL — ABNORMAL LOW (ref 4.22–5.81)
RDW: 13.5 % (ref 11.5–15.5)
WBC: 10.8 10*3/uL — ABNORMAL HIGH (ref 4.0–10.5)

## 2018-01-18 NOTE — Progress Notes (Signed)
Subjective: 2 Days Post-Op Procedure(s) (LRB): AMPUTATION FOURTH AND FIFTH TOES RIGHT FOOT (Right) INCISION AND DRAINAGE RIGHT FOOT AND ANKLE (Right) Patient reports pain as Throbbing sensation when he puts his foot down to get up.    Objective: Vital signs in last 24 hours: Temp:  [98.1 F (36.7 C)-98.2 F (36.8 C)] 98.2 F (36.8 C) (01/26 0535) Pulse Rate:  [79-80] 80 (01/26 0535) Resp:  [17-18] 17 (01/26 0535) BP: (98-113)/(55-72) 104/55 (01/26 0535) SpO2:  [94 %-98 %] 94 % (01/26 0535)  Intake/Output from previous day: 01/25 0701 - 01/26 0700 In: 240 [P.O.:240] Out: 1100 [Urine:1100] Intake/Output this shift: Total I/O In: 360 [P.O.:360] Out: -   Recent Labs    01/17/18 0514 01/18/18 0627  HGB 11.8* 12.7*   Recent Labs    01/17/18 0514 01/18/18 0627  WBC 10.7* 10.8*  RBC 3.92* 4.16*  HCT 38.3* 40.2  PLT 303 310   Recent Labs    01/17/18 0514 01/18/18 0627  NA 139 141  K 4.4 4.0  CL 99* 100*  CO2 32 30  BUN 15 14  CREATININE 0.89 0.87  GLUCOSE 101* 93  CALCIUM 9.3 9.5   No results for input(s): LABPT, INR in the last 72 hours.  Neurologically intact ABD soft Neurovascular intact Sensation intact distally Intact pulses distally Dorsiflexion/Plantar flexion intact Dressing was changed packing was changed, wound bed looked clean  Assessment/Plan: 2 Days Post-Op Procedure(s) (LRB): AMPUTATION FOURTH AND FIFTH TOES RIGHT FOOT (Right) INCISION AND DRAINAGE RIGHT FOOT AND ANKLE (Right) Awaiting culture results initial Gram stain shows staph aureus  Continue vancomycin and ciprofloxacin  Patient will need outpatient wound care every other day dressing changes wet-to-dry normal saline  Plan discharge tomorrow  Fuller CanadaStanley Keval Nam 01/18/2018, 11:30 AM

## 2018-01-19 LAB — CBC
HCT: 41.6 % (ref 39.0–52.0)
Hemoglobin: 13.2 g/dL (ref 13.0–17.0)
MCH: 30.6 pg (ref 26.0–34.0)
MCHC: 31.7 g/dL (ref 30.0–36.0)
MCV: 96.3 fL (ref 78.0–100.0)
Platelets: 336 10*3/uL (ref 150–400)
RBC: 4.32 MIL/uL (ref 4.22–5.81)
RDW: 13.5 % (ref 11.5–15.5)
WBC: 10.9 10*3/uL — AB (ref 4.0–10.5)

## 2018-01-19 MED ORDER — CLINDAMYCIN HCL 300 MG PO CAPS: 300.0000 mg | ORAL_CAPSULE | Freq: Three times a day (TID) | ORAL | 0 refills | Status: DC

## 2018-01-19 MED ORDER — NICOTINE 21 MG/24HR TD PT24: 21.0000 mg | MEDICATED_PATCH | Freq: Every day | TRANSDERMAL | 0 refills | Status: DC

## 2018-01-19 NOTE — Care Management (Signed)
Left message for Russell Luna, Liaison for [facility], that pt would d/c today.

## 2018-01-19 NOTE — Progress Notes (Addendum)
Mr MinookaSladky discharge instructions were give to him and his wife by Juanita LasterSandy Strader SN under my supervision.

## 2018-01-19 NOTE — Progress Notes (Signed)
Discharge instructions read to patient and family.  Patient verbalized understanding and discharged home with wife.

## 2018-01-20 ENCOUNTER — Encounter (HOSPITAL_COMMUNITY): Payer: Self-pay | Admitting: Orthopedic Surgery

## 2018-01-20 NOTE — Discharge Summary (Signed)
Physician Discharge Summary  Patient ID: Russell Luna MRN: [id number] DOB/AGE: 1946/09/10 72 y.o.  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  He tolerated that well was admitted to the hospital for IV broad-spectrum antibiotics.  Intraoperative cultures grew out methicillin sensitive staph aureus sensitive to clindamycin  Dressing change was performed on postop day 2, wound bed was clean dry and intact.  Granulation tissue was forming nicely.  Pain was controlled with IV Dilaudid  CBC Latest Ref Rng & Units 01/19/2018 01/18/2018 01/17/2018  WBC 4.0 - 10.5 K/uL 10.9(H) 10.8(H) 10.7(H)  Hemoglobin 13.0 - 17.0 g/dL 78.2 12.7(L) 11.8(L)  Hematocrit 39.0 - 52.0 % 41.6 40.2 38.3(L)  Platelets 150 - 400 K/uL 336 310 303   BMP Latest Ref Rng & Units 01/18/2018 01/17/2018 01/13/2018  Glucose 65 - 99 mg/dL 93 956(O) 130(Q)  BUN 6 - 20 mg/dL 14 15 17   Creatinine 0.61 - 1.24 mg/dL 6.57 8.46 9.62  Sodium 135 - 145 mmol/L 141 139 137  Potassium 3.5 - 5.1 mmol/L 4.0 4.4 4.3  Chloride 101 - 111 mmol/L 100(L) 99(L) 98(L)  CO2 22 - 32 mmol/L 30 32 29  Calcium 8.9 - 10.3 mg/dL 9.5 9.3 9.4   Component 4d ago  Specimen Description WOUND RIGHT FOOT   Special Requests NONE   Gram Stain FEW WBC PRESENT,BOTH PMN AND MONONUCLEAR  MODERATE GRAM POSITIVE COCCI  Performed at [facility], [address], [place], [place] [address]      Culture ABUNDANT STAPHYLOCOCCUS AUREUS  NO ANAEROBES ISOLATED; CULTURE IN PROGRESS FOR 5 DAYS     Report Status PENDING   Organism ID, Bacteria STAPHYLOCOCCUS AUREUS   Resulting Agency [facility]  Susceptibility    Staphylococcus aureus    MIC    CIPROFLOXACIN >=8 RESISTANT  Resistant    CLINDAMYCIN <=0.25 SENS... Sensitive    ERYTHROMYCIN >=8 RESISTANT  Resistant    GENTAMICIN <=0.5 SENSI... Sensitive    Inducible Clindamycin NEGATIVE  Sensitive    OXACILLIN <=0.25 SENS... Sensitive    RIFAMPIN <=0.5 SENSI... Sensitive    TETRACYCLINE <=1 SENSITIVE  Sensitive    TRIMETH/SULFA <=10 SENSIT... Sensitive    VANCOMYCIN 1 SENSITIVE  Sensitive         Susceptibility Comments   Staphylococcus aureus  ABUNDANT STAPHYLOCOCCUS AUREUS    Specimen Collected: 01/16/18 10:41 Last Resulted: 01/18/18 10:56      Discharge Exam: Blood pressure 124/79, pulse 90, temperature 98.1 F (36.7 C), temperature source Oral, resp. rate 19, height 5\' 11"  (1.803 m), weight 225 lb 12 oz (102.4 kg), SpO2 98 %.   Disposition: 06-Home-Health Care Svc  Discharge Instructions    Ambulatory referral to Home Health   Complete by:  As directed    Please evaluate [name] for admission to [facility].  Disciplines requested: Nursing  Services to provide: [facility]  Physician to follow patient's care (the person listed here will be  responsible for signing ongoing orders): Referring Provider  Requested Start of Care Date: Tomorrow  I certify that this patient is under my care and that I, or a Nurse Practitioner or Physician's Assistant working with me, had a face-to-face encounter that meets the physician face-to-face requirements with patient on 01/19/18. The encounter with the patient was in whole, or in part for the following medical condition(s) which is the primary reason for home health care (List medical condition). Infection right foot s/o amputation 4th and 5th digits   Special  Instructions:  Wet to dry dressings every other day teach family member how to do dressing   Does the patient have Medicare or Medicaid?:  Yes   The encounter with the patient was in whole, or in part, for the following medical condition, which is the primary reason for home health care:  infection oot - right   Reason for Medically Necessary Home Health Services:  Skilled Nursing- Complex Wound Care   My clinical findings support the need for the above services:  Unable to leave home safely without assistance and/or assistive device   I certify that, based on my findings, the following services are medically necessary home health services:  Nursing   Further, I certify that my clinical findings support that this patient is homebound due to:  Unable to leave home safely without assistance   Call MD / Call 911   Complete by:  As directed    If you experience chest pain or shortness of breath, CALL 911 and be transported to the hospital emergency room.  If you develope a fever above 101 F, pus (white drainage) or increased drainage or redness at the wound, or calf pain, call your surgeon's office.   Constipation Prevention   Complete by:  As directed    Drink plenty of fluids.  Prune juice may be helpful.  You may use a stool softener, such as Colace (over the counter) 100 mg twice a day.  Use MiraLax (over the counter) for constipation as needed.   Diet - low sodium heart healthy   Complete by:  As directed    Increase activity slowly as tolerated   Complete by:  As directed      Allergies as of 01/19/2018   No Known Allergies     Medication List    STOP taking these medications   ciprofloxacin 500 MG tablet Commonly known as:  CIPRO     TAKE these medications   atorvastatin 80 MG tablet Commonly known as:  LIPITOR Take 40 mg by mouth at bedtime.   clindamycin 300 MG capsule Commonly known as:  CLEOCIN Take 1 capsule (300 mg total) by mouth 3 (three) times daily.   docusate  sodium 100 MG capsule Commonly known as:  COLACE Take 200 mg by mouth 2 (two) times daily as needed (for constipation.).   gabapentin 100 MG capsule Commonly known as:  NEURONTIN Take 2 capsules (200 mg total) by mouth 3 (three) times daily.   hydrochlorothiazide 25 MG tablet Commonly known as:  HYDRODIURIL Take 25 mg by mouth daily.   ibuprofen 400 MG tablet Commonly known as:  ADVIL,MOTRIN Take 1 tablet (400 mg total) by mouth every 6 (six) hours. What changed:  Another medication with the same name was removed. Continue taking this medication, and follow the directions you see here.   lisinopril 40 MG tablet Commonly known as:  PRINIVIL,ZESTRIL Take 20 mg by mouth daily.   morphine 30 MG 12 hr  tablet Commonly known as:  MS CONTIN Take 30 mg by mouth every 12 (twelve) hours.   morphine 15 MG tablet Commonly known as:  MSIR Take 1 tablet (15 mg total) by mouth every 4 (four) hours as needed for severe pain.   nicotine 21 mg/24hr patch Commonly known as:  NICODERM CQ - dosed in mg/24 hours Place 1 patch (21 mg total) onto the skin daily.   polyethylene glycol powder powder Commonly known as:  GLYCOLAX/MIRALAX Take 17 g by mouth daily as needed (for constipation.).      Follow-up Information    Vickki Hearing, MD Follow up on 01/24/2018.   Specialties:  Orthopedic Surgery, Radiology Contact information: [address] [place] [address] [phone]           Signed: Fuller Canada 01/20/2018, 12:21 PM

## 2018-01-20 NOTE — Care Management (Signed)
CM received call from pt's wife who is concerned that she had not heard for [facility] yet. CM contacted [facility] who has all information needed and is aware of DC. PT was supposed to have already reached out to pt's. RN will attempt contact this afternoon.

## 2018-01-21 LAB — AEROBIC/ANAEROBIC CULTURE W GRAM STAIN (SURGICAL/DEEP WOUND)

## 2018-01-21 LAB — AEROBIC/ANAEROBIC CULTURE (SURGICAL/DEEP WOUND)

## 2018-01-23 DIAGNOSIS — Z89421 Acquired absence of other right toe(s): Secondary | ICD-10-CM | POA: Insufficient documentation

## 2018-01-24 ENCOUNTER — Encounter: Payer: Self-pay | Admitting: Orthopedic Surgery

## 2018-01-24 ENCOUNTER — Ambulatory Visit (INDEPENDENT_AMBULATORY_CARE_PROVIDER_SITE_OTHER): Payer: Self-pay | Admitting: Orthopedic Surgery

## 2018-01-24 VITALS — BP 129/83 | HR 81 | Ht 71.0 in

## 2018-01-24 DIAGNOSIS — Z9889 Other specified postprocedural states: Secondary | ICD-10-CM

## 2018-01-24 DIAGNOSIS — Z8781 Personal history of (healed) traumatic fracture: Secondary | ICD-10-CM

## 2018-01-24 DIAGNOSIS — Z4889 Encounter for other specified surgical aftercare: Secondary | ICD-10-CM

## 2018-01-24 DIAGNOSIS — Z89421 Acquired absence of other right toe(s): Secondary | ICD-10-CM

## 2018-01-24 DIAGNOSIS — Z967 Presence of other bone and tendon implants: Secondary | ICD-10-CM

## 2018-01-24 MED ORDER — MORPHINE SULFATE 15 MG PO TABS
15.0000 mg | ORAL_TABLET | ORAL | 0 refills | Status: DC | PRN
Start: 2018-01-24 — End: 2018-02-14

## 2018-01-24 MED ORDER — GABAPENTIN 100 MG PO CAPS: 200.0000 mg | ORAL_CAPSULE | Freq: Three times a day (TID) | ORAL | 2 refills | Status: DC

## 2018-01-24 NOTE — Progress Notes (Signed)
POST OP VISIT   Patient ID: Russell Luna, male   DOB: 08-13-1946, 72 y.o.   MRN: [id number]  Chief Complaint  Patient presents with  . Follow-up    Recheck on right foot, DOS 01-16-18.    Encounter Diagnoses  Name Primary?  . S/P ORIF (open reduction internal fixation) fracture 12/09/17 Yes  . S/P amputation of lesser toe, right (HCC) 01/16/18     The wound bed looks clean dry and intact the debrided eschar areas look clean dry and intact.  His swelling is less erythematous less no tenderness or pain he is developing somewhat of a supination plantarflexion contracture  Placed in Aircast after dressing changes  Continue wet-to-dry dressings follow-up in a week  Meds ordered this encounter  Medications  . morphine (MSIR) 15 MG tablet    Sig: Take 1 tablet (15 mg total) by mouth every 4 (four) hours as needed for severe pain.    Dispense:  42 tablet    Refill:  0  . gabapentin (NEURONTIN) 100 MG capsule    Sig: Take 2 capsules (200 mg total) by mouth 3 (three) times daily.    Dispense:  90 capsule    Refill:  2    Continue clindamycin

## 2018-01-24 NOTE — Patient Instructions (Signed)
To Nurse :  2 x 2 wet  2 x 2 dry   zeroform  Cling

## 2018-01-31 ENCOUNTER — Ambulatory Visit (INDEPENDENT_AMBULATORY_CARE_PROVIDER_SITE_OTHER): Payer: Medicare Other

## 2018-01-31 ENCOUNTER — Ambulatory Visit (INDEPENDENT_AMBULATORY_CARE_PROVIDER_SITE_OTHER): Payer: Medicare Other | Admitting: Orthopedic Surgery

## 2018-01-31 ENCOUNTER — Encounter: Payer: Self-pay | Admitting: Orthopedic Surgery

## 2018-01-31 VITALS — BP 126/77 | HR 90 | Ht 71.0 in

## 2018-01-31 DIAGNOSIS — Z967 Presence of other bone and tendon implants: Secondary | ICD-10-CM

## 2018-01-31 DIAGNOSIS — Z89421 Acquired absence of other right toe(s): Secondary | ICD-10-CM

## 2018-01-31 DIAGNOSIS — Z9889 Other specified postprocedural states: Secondary | ICD-10-CM

## 2018-01-31 DIAGNOSIS — Z8781 Personal history of (healed) traumatic fracture: Secondary | ICD-10-CM | POA: Diagnosis not present

## 2018-01-31 NOTE — Progress Notes (Signed)
Postop appointment  Encounter Diagnoses  Name Primary?  . 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 S/P ORIF (open reduction internal fixation) fracture 12/09/17     Chief Complaint  Patient presents with  . Post-op Follow-up    right foot ankle 12/09/17 and 01/16/18    Postop day number 53 (7 weeks 4 days) Ankle fracture  Postop day 16 amputation fourth and fifth digit right foot  Currently getting wet-to-dry dressings twice a week with home health  Currently on oral antibiotics   Current related medicines   .  clindamycin (CLEOCIN) 300 MG capsule, Take 1 capsule (300 mg total) by mouth 3 (three) times daily., Disp: 90 capsule, Rfl: 0 .  morphine (MS CONTIN) 30 MG 12 hr tablet, Take 30 mg by mouth every 12 (twelve) hours., Disp: , Rfl:  .  morphine (MSIR) 15 MG tablet, Take 1 tablet (15 mg total) by mouth every 4 (four) hours as needed for severe pain., Disp: 42 tablet, Rfl: 0 .  nicotine (NICODERM CQ - DOSED IN MG/24 HOURS) 21 mg/24hr patch, Place 1 patch (21 mg total) onto the skin daily., Disp: 28 patch, Rfl: 0 .  ibuprofen (ADVIL,MOTRIN) 400 MG tablet, Take 1 tablet (400 mg total) by mouth every 6 (six) hours. (Patient not taking: Reported on 01/10/2018), Disp: 30 tablet, Rfl: 0  WOUND  The amputation site wound is approximate depth centimeter with 2 cm and then the superficial skin over the dorsum and dorsal lateral portion of the foot and a small area the medial portion of the foot also has loss of epithelium but it is healing okay  Plan is going to be for dressing changes as many as possible hopefully 3 times a week  X-ray today shows the fractures are healing in the medial malleolus and lateral malleolus with internal fixation although there is some evidence of nondisplaced medial malleolus fracture where the fixation is cut through but should not be of any consequence  Follow-up on the 22nd wound check

## 2018-02-07 ENCOUNTER — Telehealth: Payer: Self-pay | Admitting: Orthopedic Surgery

## 2018-02-07 DIAGNOSIS — Z89421 Acquired absence of other right toe(s): Secondary | ICD-10-CM

## 2018-02-07 DIAGNOSIS — S92502D Displaced unspecified fracture of left lesser toe(s), subsequent encounter for fracture with routine healing: Secondary | ICD-10-CM

## 2018-02-07 NOTE — Telephone Encounter (Signed)
Victorino DikeJennifer with [facility] called asking for an order to be faxed to [facility] for wound dressing changes 3 times a week. She said Dr. Romeo AppleHarrison gave her a verbal, but she needs a written one, also. It needs to be faxed to [phone] with attention Melissa Hairston. She also stated that she needs Dr. Romeo AppleHarrison to sign and have faxed back to [employer] her order for supplies that is in his box to be signed. She said she has supplies now,  but would like to keep supplies up.  Please advise.

## 2018-02-10 NOTE — Telephone Encounter (Signed)
Supplies list faxed today, in box on [facility] desk [facility] will [facility] when signed   Do you have a phone number for Victorino DikeJennifer So I can call her with verbal?

## 2018-02-11 NOTE — Telephone Encounter (Signed)
Thanks, this was fax number, if they call back, I can give verbal.

## 2018-02-11 NOTE — Telephone Encounter (Signed)
The only number she gave me was the one in previous note.

## 2018-02-14 ENCOUNTER — Ambulatory Visit (INDEPENDENT_AMBULATORY_CARE_PROVIDER_SITE_OTHER): Payer: Medicare Other | Admitting: Orthopedic Surgery

## 2018-02-14 VITALS — BP 108/72 | HR 104 | Ht 71.0 in | Wt 212.0 lb

## 2018-02-14 DIAGNOSIS — Z8781 Personal history of (healed) traumatic fracture: Secondary | ICD-10-CM

## 2018-02-14 DIAGNOSIS — Z9889 Other specified postprocedural states: Secondary | ICD-10-CM

## 2018-02-14 DIAGNOSIS — Z4889 Encounter for other specified surgical aftercare: Secondary | ICD-10-CM

## 2018-02-14 DIAGNOSIS — Z967 Presence of other bone and tendon implants: Secondary | ICD-10-CM

## 2018-02-14 DIAGNOSIS — Z89421 Acquired absence of other right toe(s): Secondary | ICD-10-CM

## 2018-02-14 MED ORDER — CLINDAMYCIN HCL 300 MG PO CAPS: 300.0000 mg | ORAL_CAPSULE | Freq: Three times a day (TID) | ORAL | 0 refills | Status: DC

## 2018-02-14 MED ORDER — MORPHINE SULFATE 15 MG PO TABS: 15.0000 mg | ORAL_TABLET | ORAL | 0 refills | Status: AC | PRN

## 2018-02-14 NOTE — Progress Notes (Signed)
Chief Complaint  Patient presents with  . Follow-up    Recheck on right foot with wound check, DOS 01-16-18.    Patient has had 2 procedures he had amputation fourth and fifth digit right foot on 24 January he had open reduction internal fixation irrigation debridement for open fracture back on December 17  He has open wounds in the lateral portion of the foot and we are packing the amputation site  He has some swelling of his foot mild erythema does not appear to be infected.  [facility] would like to take over his pain management which is fine I think the increase by 115 mg morphine tablet per day should be adequate we will fax him the information  I will follow-up regarding his wound March 15  I gave him enough medication to get to 25 Feb  Meds ordered this encounter  Medications  . morphine (MSIR) 15 MG tablet    Sig: Take 1 tablet (15 mg total) by mouth every 4 (four) hours as needed for severe pain.    Dispense:  12 tablet    Refill:  0

## 2018-02-14 NOTE — Addendum Note (Signed)
Addended by: Vickki HearingHARRISON, Chandni Gagan E on: 02/14/2018 12:03 PM   Modules accepted: Orders

## 2018-02-17 ENCOUNTER — Telehealth: Payer: Self-pay | Admitting: Orthopedic Surgery

## 2018-02-17 NOTE — Telephone Encounter (Signed)
I faxed them on Friday. If they did not go through, ok to refax note from Friday to the number patient provided.

## 2018-02-17 NOTE — Telephone Encounter (Signed)
Patient called to follow up on what he states was discussed at office visit Friday, 02/14/18 regarding notes and medication information [facility], Dr Marveen Reeksonald Murphy.  I have notes printed if needed, under "release of information" to be sent to Fax# [phone]/ ph # [phone].  Patient is concerned as states his appointment there is tomorrow, 02/18/18.

## 2018-02-17 NOTE — Telephone Encounter (Signed)
Re-faxed to fax# noted. Patient aware.

## 2018-03-07 ENCOUNTER — Ambulatory Visit (INDEPENDENT_AMBULATORY_CARE_PROVIDER_SITE_OTHER): Payer: Medicare Other | Admitting: Orthopedic Surgery

## 2018-03-07 ENCOUNTER — Encounter: Payer: Self-pay | Admitting: Orthopedic Surgery

## 2018-03-07 VITALS — BP 160/86 | HR 89 | Ht 71.0 in

## 2018-03-07 DIAGNOSIS — S82841E Displaced bimalleolar fracture of right lower leg, subsequent encounter for open fracture type I or II with routine healing: Secondary | ICD-10-CM

## 2018-03-07 DIAGNOSIS — Z89421 Acquired absence of other right toe(s): Secondary | ICD-10-CM

## 2018-03-07 DIAGNOSIS — Z9889 Other specified postprocedural states: Secondary | ICD-10-CM

## 2018-03-07 DIAGNOSIS — Z8781 Personal history of (healed) traumatic fracture: Secondary | ICD-10-CM

## 2018-03-07 DIAGNOSIS — Z967 Presence of other bone and tendon implants: Secondary | ICD-10-CM

## 2018-03-07 MED ORDER — CLINDAMYCIN HCL 300 MG PO CAPS: 300.0000 mg | ORAL_CAPSULE | Freq: Three times a day (TID) | ORAL | 0 refills | Status: AC

## 2018-03-07 MED ORDER — GABAPENTIN 100 MG PO CAPS: 200.0000 mg | ORAL_CAPSULE | Freq: Three times a day (TID) | ORAL | 2 refills | Status: DC

## 2018-03-07 NOTE — Progress Notes (Signed)
Chief Complaint  Patient presents with  . Wound Check    still has drainage top of foot, improving   . Routine Post Op    12/09/17 ORIF ankle 01/16/18 toe amputations     Debrided the foot today.  Otherwise doing well.  X-ray next time.  Continue dressing changes gabapentin clindamycin  [facility]  Follow-up in 3 weeks

## 2018-03-11 ENCOUNTER — Telehealth: Payer: Self-pay | Admitting: Orthopedic Surgery

## 2018-03-11 NOTE — Telephone Encounter (Signed)
Call from [place]ela, [facility] - relays she had seen patient yesterday, 03/10/18, and is seeing him tomorrow, 03/12/18; asking, due to some "yellow slough" - asking if she may add "Santyl" along with the wet to dry dressing. Please leave a detailed message at [facility] [phone].

## 2018-03-11 NOTE — Telephone Encounter (Signed)
yes

## 2018-03-12 MED ORDER — COLLAGENASE 250 UNIT/GM EX OINT: 1.0000 "application " | TOPICAL_OINTMENT | Freq: Every day | CUTANEOUS | 0 refills | Status: DC

## 2018-03-12 NOTE — Telephone Encounter (Signed)
Russell Burkealled Russell, [facility]

## 2018-03-28 ENCOUNTER — Ambulatory Visit (INDEPENDENT_AMBULATORY_CARE_PROVIDER_SITE_OTHER): Payer: Medicare Other

## 2018-03-28 ENCOUNTER — Encounter: Payer: Self-pay | Admitting: Orthopedic Surgery

## 2018-03-28 ENCOUNTER — Ambulatory Visit (INDEPENDENT_AMBULATORY_CARE_PROVIDER_SITE_OTHER): Payer: Self-pay | Admitting: Orthopedic Surgery

## 2018-03-28 ENCOUNTER — Other Ambulatory Visit: Payer: Medicare Other

## 2018-03-28 ENCOUNTER — Other Ambulatory Visit: Payer: Self-pay | Admitting: Orthopedic Surgery

## 2018-03-28 VITALS — Ht 71.0 in | Wt 212.0 lb

## 2018-03-28 DIAGNOSIS — Z8781 Personal history of (healed) traumatic fracture: Secondary | ICD-10-CM

## 2018-03-28 DIAGNOSIS — S82841E Displaced bimalleolar fracture of right lower leg, subsequent encounter for open fracture type I or II with routine healing: Secondary | ICD-10-CM

## 2018-03-28 DIAGNOSIS — Z967 Presence of other bone and tendon implants: Secondary | ICD-10-CM | POA: Diagnosis not present

## 2018-03-28 DIAGNOSIS — Z9889 Other specified postprocedural states: Secondary | ICD-10-CM

## 2018-03-28 DIAGNOSIS — Z89421 Acquired absence of other right toe(s): Secondary | ICD-10-CM

## 2018-03-28 MED ORDER — COLLAGENASE 250 UNIT/GM EX OINT: 1.0000 "application " | TOPICAL_OINTMENT | Freq: Every day | CUTANEOUS | 2 refills | Status: DC

## 2018-03-28 NOTE — Progress Notes (Signed)
POSTOP VISIT  Status post lesser toe amputations fourth and fifth ray  POD # 78  Ht 5\' 11"  (1.803 m)   Wt 212 lb (96.2 kg)   BMI 29.57 kg/m   Encounter Diagnoses  Name Primary?  . 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 S/P ORIF (open reduction internal fixation) fracture 12/09/17   . Type I or II open bimalleolar fracture of right ankle with routine healing, subsequent encounter      Appears to have a slight plantar flexion contracture which improves with flexing of the knee.  Indicating some gastroc tightness.  Foot is supinating from the overpull of the tibialis anterior  Wound looks clean dry and intact with superficial skin healing  X-ray shows healing of the fracture of the ankle  He can start weightbearing and do light driving  See [employer] for possible weightbearing shoe  Follow-up 6 weeks

## 2018-03-31 ENCOUNTER — Telehealth: Payer: Self-pay | Admitting: Orthopedic Surgery

## 2018-03-31 NOTE — Telephone Encounter (Signed)
Patient/spouse, designated contact, [name], called with contact phone/fax number to [facility] as discussed at office visit Friday, 4/5/519: Phone# [phone] / FAX# [phone].  I relayed to patient(spouse) that I can forward the office notes today, which have also been requested, through "release of information" process.

## 2018-03-31 NOTE — Telephone Encounter (Signed)
[facility].

## 2018-04-01 ENCOUNTER — Telehealth: Payer: Self-pay | Admitting: Radiology

## 2018-04-01 NOTE — Telephone Encounter (Signed)
I spoke to Russell Luna today regarding his shoe order, clarified we would like the orthotist to recommend which shoe would be best for his situation. He is s/p 4/5th ray amputation and he will begin weight bearing as soon as a shoe is available. They will let us know if any other questions arise, otherwise, will send orders for you to sign.  To you FYI, expect orders soon

## 2018-04-04 ENCOUNTER — Telehealth: Payer: Self-pay | Admitting: Orthopedic Surgery

## 2018-04-04 NOTE — Telephone Encounter (Signed)
Call received from [place]ela, [facility]; states seiing patient today as scheduled for wound care. Asking if Dr Romeo AppleHarrison wishes to also prescribe physical therapy.  Please advise (may leave detailed message at her secure voice mail) ph# [phone].

## 2018-04-04 NOTE — Telephone Encounter (Signed)
Not now per Dr Romeo AppleHarrison, will discuss with him at next visit, to see if therapy needed then.

## 2018-04-09 ENCOUNTER — Telehealth: Payer: Self-pay | Admitting: Orthopedic Surgery

## 2018-04-09 NOTE — Telephone Encounter (Signed)
Yes, he can start wearing it [facility]AT advised to take it slow, a little at a time. His wife has voiced understanding.

## 2018-04-09 NOTE — Telephone Encounter (Signed)
Pt's wife [name] called and stated that Mr. Russell Luna got his shoe yesterday.  They want to know if he is to start wearing it now or not.

## 2018-04-17 ENCOUNTER — Telehealth: Payer: Self-pay | Admitting: Orthopedic Surgery

## 2018-04-17 NOTE — Telephone Encounter (Signed)
Pt's wife Renea Ee(Evelyn) left message on voicemail asking about pt's Clindamycin or Vancomycin medication. Could not understand first of the medication name. Does he still need to take it, if so he needs a refill.  Please call and revise  [phone]

## 2018-04-18 NOTE — Telephone Encounter (Signed)
Ok to stop

## 2018-04-18 NOTE — Telephone Encounter (Signed)
I called to advise.  

## 2018-04-24 ENCOUNTER — Other Ambulatory Visit: Payer: Self-pay | Admitting: Orthopedic Surgery

## 2018-05-09 ENCOUNTER — Ambulatory Visit (INDEPENDENT_AMBULATORY_CARE_PROVIDER_SITE_OTHER): Payer: Medicare Other | Admitting: Orthopedic Surgery

## 2018-05-09 VITALS — BP 135/81 | HR 75 | Ht 71.0 in | Wt 212.0 lb

## 2018-05-09 DIAGNOSIS — Z89421 Acquired absence of other right toe(s): Secondary | ICD-10-CM

## 2018-05-09 DIAGNOSIS — Z967 Presence of other bone and tendon implants: Secondary | ICD-10-CM | POA: Diagnosis not present

## 2018-05-09 DIAGNOSIS — Z8781 Personal history of (healed) traumatic fracture: Secondary | ICD-10-CM

## 2018-05-09 DIAGNOSIS — Z9889 Other specified postprocedural states: Secondary | ICD-10-CM

## 2018-05-09 MED ORDER — GABAPENTIN 100 MG PO CAPS: ORAL_CAPSULE | ORAL | 0 refills | Status: DC

## 2018-05-09 NOTE — Patient Instructions (Signed)
Physical therapy stair training  Continue current wound care  Weight-bear as tolerated with the boot and brace  Follow-up 6 weeks

## 2018-05-09 NOTE — Progress Notes (Signed)
Progress Note   Patient ID: Russell Luna, male   DOB: 03-29-1946, 72 y.o.   MRN: [id number]  Chief Complaint  Patient presents with  . Follow-up    6 week recheck on right foot, DOS 01-16-18.     Medical decision-making Encounter Diagnoses  Name Primary?  . 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 S/P ORIF (open reduction internal fixation) fracture 12/09/17      PLAN:  Continue with the special shoe to accommodate the swelling and deformity of his foot continue with the Aircast  Weight-bear as tolerated  Continue gabapentin  Follow-up in 6 weeks    Meds ordered this encounter  Medications  . gabapentin (NEURONTIN) 100 MG capsule    Sig: 2 tablets 3 times a day 100 mg    Dispense:  180 capsule    Refill:  0         Chief Complaint  Patient presents with  . Follow-up    6 week recheck on right foot, DOS 01-16-18.    72 year old male had an open fracture of his ankle treated with open reduction internal fixation and also had open fractures of the third fourth and fifth toes with fractures in the foot  He has had a long course of wound care antibiotics which she is now off of he comes in with a plantigrade foot today wearing a shoe and an ankle Aircast with a small superficial wound at the distal end of the foot  Complains of no pain is been very active he is been planting in the garden, he has a knee walker which he uses well and is been able to bear some weight    Review of Systems  Constitutional: Negative for chills and fever.   Current Meds  Medication Sig  . atorvastatin (LIPITOR) 80 MG tablet Take 40 mg by mouth at bedtime.   . collagenase (SANTYL) ointment Apply 1 application topically daily.  Marland Kitchen docusate sodium (COLACE) 100 MG capsule Take 200 mg by mouth 2 (two) times daily as needed (for constipation.).  Marland Kitchen gabapentin (NEURONTIN) 100 MG capsule 2 tablets 3 times a day 100 mg  . hydrochlorothiazide (HYDRODIURIL) 25 MG tablet Take 25  mg by mouth daily.  Marland Kitchen ibuprofen (ADVIL,MOTRIN) 400 MG tablet Take 1 tablet (400 mg total) by mouth every 6 (six) hours.  Marland Kitchen lisinopril (PRINIVIL,ZESTRIL) 40 MG tablet Take 20 mg by mouth daily.   Marland Kitchen morphine (MS CONTIN) 30 MG 12 hr tablet Take 30 mg by mouth every 12 (twelve) hours.  Marland Kitchen morphine (MSIR) 15 MG tablet Take 1 tablet (15 mg total) by mouth every 4 (four) hours as needed for severe pain.  . nicotine (NICODERM CQ - DOSED IN MG/24 HOURS) 21 mg/24hr patch Place 1 patch (21 mg total) onto the skin daily.  . polyethylene glycol powder (GLYCOLAX/MIRALAX) powder Take 17 g by mouth daily as needed (for constipation.).  . [DISCONTINUED] gabapentin (NEURONTIN) 100 MG capsule TAKE 2 CAPSULES BY MOUTH 3 TIMES DAILY.    No Known Allergies   BP 135/81   Pulse 75   Ht  (1.803 m)   Wt 212 lb (96.2 kg)   BMI 29.57 kg/m   Physical Exam  Musculoskeletal:       Feet:       Russell Canada, MD 05/09/2018 11:36 AM

## 2018-05-15 ENCOUNTER — Telehealth: Payer: Self-pay | Admitting: Orthopedic Surgery

## 2018-05-15 NOTE — Telephone Encounter (Signed)
Angel with [facility] called needing a verbal order for physcial therapy and eval for this patient. She said that if she didn't answer when you call, please leave a message. Her voicemail is secure.  [phone] Please call and advise

## 2018-05-15 NOTE — Telephone Encounter (Signed)
Left voicemail, this was faxed last week. Now gave verbal

## 2018-06-05 ENCOUNTER — Telehealth: Payer: Self-pay | Admitting: Orthopedic Surgery

## 2018-06-05 NOTE — Telephone Encounter (Signed)
[facility], requests orders for frequency of visits 2x per week for the next 2 weeks. Please call her at direct phone which has secure voice message #[phone].

## 2018-06-05 NOTE — Telephone Encounter (Signed)
Called to give verbal order,

## 2018-06-09 ENCOUNTER — Other Ambulatory Visit: Payer: Self-pay | Admitting: Orthopedic Surgery

## 2018-06-09 ENCOUNTER — Telehealth: Payer: Self-pay | Admitting: Orthopedic Surgery

## 2018-06-09 MED ORDER — GABAPENTIN 100 MG PO CAPS: ORAL_CAPSULE | ORAL | 0 refills | Status: DC

## 2018-06-09 NOTE — Telephone Encounter (Signed)
Patient's wife is asking if the doctor wants him to continue taking this.  Gabapentin 100 mg   Qty 180 Capsules  2 tablets 3 times a day 100 mg.  PATIENT USES [facility]

## 2018-06-12 ENCOUNTER — Telehealth: Payer: Self-pay | Admitting: Orthopedic Surgery

## 2018-06-12 NOTE — Telephone Encounter (Signed)
Call received from [place], [facility], requesting new orders/re-certification for wound care as follows:  Would like to discontinue Santyl, and start to apply collagen with a dry dressing over top, 2 x a week for 3 weeks. Patient's next scheduled appointment is 06/20/18. Please call (and if not available, leave message on secure voice mail) [phone]#[phone]

## 2018-06-13 NOTE — Telephone Encounter (Signed)
YES

## 2018-06-13 NOTE — Telephone Encounter (Signed)
I called to give verbal order.  

## 2018-06-20 ENCOUNTER — Encounter: Payer: Self-pay | Admitting: Orthopedic Surgery

## 2018-06-20 ENCOUNTER — Ambulatory Visit: Payer: Medicare Other | Admitting: Orthopedic Surgery

## 2018-06-20 VITALS — BP 126/73 | HR 76 | Ht 71.0 in | Wt 212.0 lb

## 2018-06-20 DIAGNOSIS — Z89421 Acquired absence of other right toe(s): Secondary | ICD-10-CM

## 2018-06-20 DIAGNOSIS — Z8781 Personal history of (healed) traumatic fracture: Secondary | ICD-10-CM

## 2018-06-20 DIAGNOSIS — Z967 Presence of other bone and tendon implants: Secondary | ICD-10-CM | POA: Diagnosis not present

## 2018-06-20 DIAGNOSIS — Z9889 Other specified postprocedural states: Secondary | ICD-10-CM

## 2018-06-20 NOTE — Progress Notes (Signed)
Progress Note   Patient ID: Charlestine Massedhomas H Mccreery, male   DOB: 1946/04/12, 72 y.o.   MRN: [id number]  Chief Complaint  Patient presents with  . Follow-up    recheck on right foot, DOS 01-16-18.     Medical decision-making Encounter Diagnoses  Name Primary?  . 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 S/P ORIF (open reduction internal fixation) fracture 12/09/17    Small superficial wound now foot swollen he is in a walking brace/boot with Aircast  Everything looks really good follow-up in 2 months  PLAN:  Continue with the special shoe to accommodate the swelling and deformity of his foot continue with the Aircast  Weight-bear as tolerated  Continue gabapentin  Follow-up in 6 weeks    Meds ordered this encounter  Medications  . gabapentin (NEURONTIN) 100 MG capsule    Sig: 2 tablets 3 times a day 100 mg    Dispense:  180 capsule    Refill:  0

## 2018-06-25 NOTE — Telephone Encounter (Signed)
Called to advise he is already going up the stairs fine, his dorsiflexion is normal. He can d/c aircast when he is ready. Wound care will continue.

## 2018-06-25 NOTE — Telephone Encounter (Signed)
WOUND CARE WEAR BOOT INDEFINITELY UNTIL WOUND HEALS AND SWELLING GOES DOWN   DOESN'T NEED ANY EXERCISES   STAIR CLIMBING TEACHING WOULD HELP

## 2018-06-25 NOTE — Telephone Encounter (Signed)
[facility] physical therapist, Aurelio BrashJoey, calling for further instructions on this patient, including how much longer to wear boot, any specific home exercises.  Direct ph# [phone]

## 2018-07-07 ENCOUNTER — Telehealth: Payer: Self-pay | Admitting: Orthopedic Surgery

## 2018-07-07 DIAGNOSIS — Z89421 Acquired absence of other right toe(s): Secondary | ICD-10-CM

## 2018-07-07 NOTE — Telephone Encounter (Signed)
Patient and wife(designated party contact on file) called to request a new prescription for the walking shoe, as states it has worn down and is giving way. States originally fitted at [facility], and said was told to contact our office for new prescription. Patient's ph# is [phone].

## 2018-07-08 NOTE — Telephone Encounter (Signed)
I have put in an order for the shoe, told wife will fax it over tomorrow and she can call for appointment.   She has also asked you to send in a refill of Gabapentin

## 2018-07-11 ENCOUNTER — Other Ambulatory Visit: Payer: Self-pay | Admitting: Orthopedic Surgery

## 2018-07-11 MED ORDER — GABAPENTIN 100 MG PO CAPS: ORAL_CAPSULE | ORAL | 0 refills | Status: DC

## 2018-07-11 NOTE — Telephone Encounter (Signed)
Patient/spouse (desigated party contact) called back to follow up on the request per 7/15-7/16/19 note for medication - if patient is to continue this medicine:  gabapentin (NEURONTIN) 100 MG capsule (Discontinued) 180 capsule 0  -[facility]

## 2018-07-14 ENCOUNTER — Telehealth: Payer: Self-pay | Admitting: Orthopedic Surgery

## 2018-07-14 NOTE — Telephone Encounter (Signed)
Called Angela left vm with Dr Harrison's orders.

## 2018-07-14 NOTE — Telephone Encounter (Signed)
Russell Luna at [facility], Ph# [phone], requests verbal orders to continue wound care for 2x per week for 4 weeks; states no changes to wound care. Please call and may leave a detailed message on secure voice mail at this phone number.

## 2018-08-11 ENCOUNTER — Telehealth: Payer: Self-pay | Admitting: Orthopedic Surgery

## 2018-08-11 NOTE — Telephone Encounter (Signed)
I have called to give verbal order to continue wound care for him.

## 2018-08-11 NOTE — Telephone Encounter (Signed)
Russell Luna with [facility] left message on voicemail asking for someone to give her a call. She is needing a verbal because she is wanting to continue to see patient twice a week for 8 weeks for wound care to R foot. She will continue the collagen and dry dressing.   Please call and advise.  You can leave a message if no answer she states she has secure voicemail.  [phone].

## 2018-08-12 ENCOUNTER — Other Ambulatory Visit: Payer: Self-pay | Admitting: Orthopedic Surgery

## 2018-08-12 MED ORDER — GABAPENTIN 100 MG PO CAPS: ORAL_CAPSULE | ORAL | 2 refills | Status: DC

## 2018-08-12 NOTE — Telephone Encounter (Signed)
Patient requests refill on Gabapentin 100 mgs.  Qty  180  Sig: 2 tablets 3 times a day 100 mg  Patient states he uses [facility]

## 2018-08-22 ENCOUNTER — Ambulatory Visit: Payer: Medicare Other | Admitting: Orthopedic Surgery

## 2018-08-22 ENCOUNTER — Encounter: Payer: Self-pay | Admitting: Orthopedic Surgery

## 2018-08-22 VITALS — BP 103/56 | HR 76 | Ht 71.0 in | Wt 213.0 lb

## 2018-08-22 DIAGNOSIS — Z8781 Personal history of (healed) traumatic fracture: Secondary | ICD-10-CM

## 2018-08-22 DIAGNOSIS — Z89421 Acquired absence of other right toe(s): Secondary | ICD-10-CM

## 2018-08-22 DIAGNOSIS — S92502D Displaced unspecified fracture of left lesser toe(s), subsequent encounter for fracture with routine healing: Secondary | ICD-10-CM | POA: Diagnosis not present

## 2018-08-22 DIAGNOSIS — S92514D Nondisplaced fracture of proximal phalanx of right lesser toe(s), subsequent encounter for fracture with routine healing: Secondary | ICD-10-CM

## 2018-08-22 DIAGNOSIS — Z9889 Other specified postprocedural states: Secondary | ICD-10-CM

## 2018-08-22 DIAGNOSIS — Z967 Presence of other bone and tendon implants: Secondary | ICD-10-CM | POA: Diagnosis not present

## 2018-08-22 DIAGNOSIS — S92511D Displaced fracture of proximal phalanx of right lesser toe(s), subsequent encounter for fracture with routine healing: Secondary | ICD-10-CM

## 2018-08-22 DIAGNOSIS — S82841E Displaced bimalleolar fracture of right lower leg, subsequent encounter for open fracture type I or II with routine healing: Secondary | ICD-10-CM | POA: Diagnosis not present

## 2018-08-22 NOTE — Progress Notes (Signed)
Chief Complaint  Patient presents with  . Foot Pain    DOS 01/16/18    Routine follow-up  Patient has a 40 mm x 15 mm superficial wound status post amputation fourth and fifth digit right foot status post open treatment internal fixation open fracture right ankle bimalleolar fixation  Wound looks very good he is walking with a special shoe  I will see him in 2 months  Encounter Diagnoses  Name Primary?  . S/P ORIF (open reduction internal fixation) fracture 12/09/17 Yes  . Type I or II open bimalleolar fracture of right ankle with routine healing, subsequent encounter   . 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 Open fracture of phalanx of left fourth toe with routine healing, subsequent encounter   . Open displaced fracture of proximal phalanx of lesser toe of right foot with routine healing, subsequent encounter   . Open nondisplaced fracture of proximal phalanx of lesser toe of right foot with routine healing, subsequent encounter

## 2018-10-13 ENCOUNTER — Telehealth: Payer: Self-pay | Admitting: Orthopedic Surgery

## 2018-10-13 NOTE — Telephone Encounter (Signed)
Call received from Thornton at [facility], direct ph#[phone], for verbal orders to continue wound care to right foot, 2 times per week for 9 weeks, and to continue with collagen and dry dressings.  Please call, and please leave a message on her secure voice mail if she is not immediately available.

## 2018-10-14 NOTE — Telephone Encounter (Signed)
Gave order.

## 2018-10-22 ENCOUNTER — Ambulatory Visit: Payer: Medicare Other | Admitting: Orthopedic Surgery

## 2018-10-22 ENCOUNTER — Encounter: Payer: Self-pay | Admitting: Orthopedic Surgery

## 2018-10-22 VITALS — BP 106/66 | HR 67 | Ht 71.0 in | Wt 212.0 lb

## 2018-10-22 DIAGNOSIS — Z9889 Other specified postprocedural states: Secondary | ICD-10-CM | POA: Diagnosis not present

## 2018-10-22 DIAGNOSIS — Z8781 Personal history of (healed) traumatic fracture: Secondary | ICD-10-CM | POA: Diagnosis not present

## 2018-10-22 DIAGNOSIS — Z89421 Acquired absence of other right toe(s): Secondary | ICD-10-CM

## 2018-10-22 NOTE — Patient Instructions (Signed)
Stop gabapentin  Wear the regular shoe and then get the other shoe adjusted if needed   Follow-up 6 months

## 2018-10-22 NOTE — Progress Notes (Signed)
Chief Complaint  Patient presents with  . Dressing Change    has improved/ has questions about shoe wear. shoe from [employer] rubbed blister on foot   . Medication Refill    needs Gabapentin, but wants to know if he can d/c it now     Encounter Diagnoses  Name Primary?  . 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 S/P ORIF (open reduction internal fixation) fracture 12/09/17     Russell Luna is doing well he did rub a blister in the last shoe configuration now is in a soft shoe he has adequate functional dorsal plantarflexion of the foot he has a small wound over the top of the foot which is clean  He is missing 2 digits  He is ambulatory with no assistive devices  Recommend stopping the gabapentin and see if he can tolerate it since he is not having any burning  Follow-up in 6 months x-rays foot and ankle

## 2018-11-12 IMAGING — RF DG ANKLE 2V *R*
1 series · 13 of 13 positions shown · non-contrast
Comparison: Preoperative radiographs and CT

CLINICAL DATA: Open right ankle fracture.

EXAM:
RIGHT ANKLE - 2 VIEW; DG C-ARM 1-60 MIN-NO REPORT

[Series 1: run · 13 of 13 slices shown]
[im 1/13]
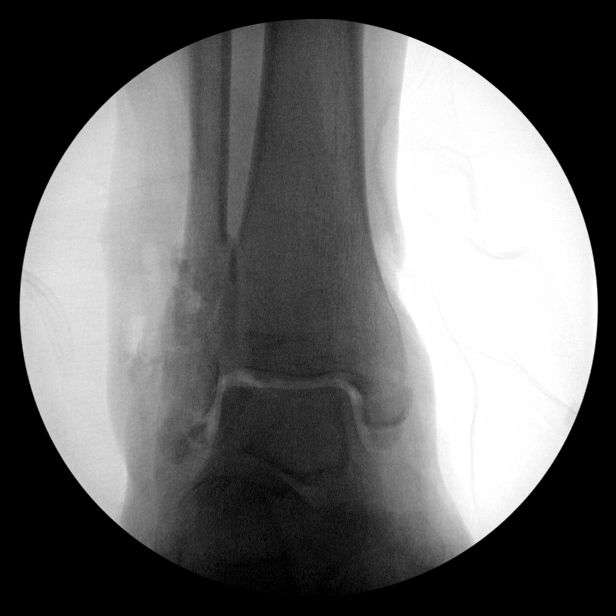
[im 2/13]
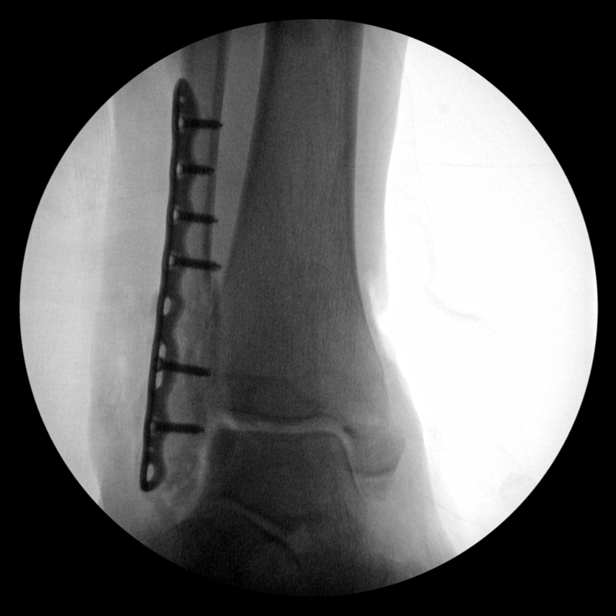
[im 3/13]
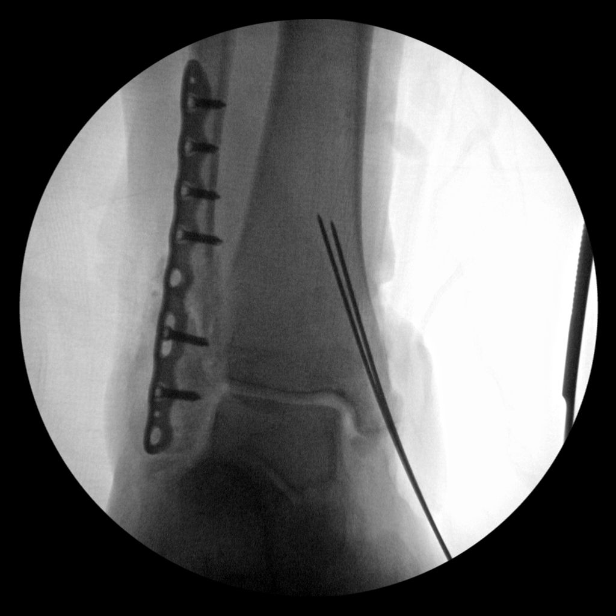
[im 4/13]
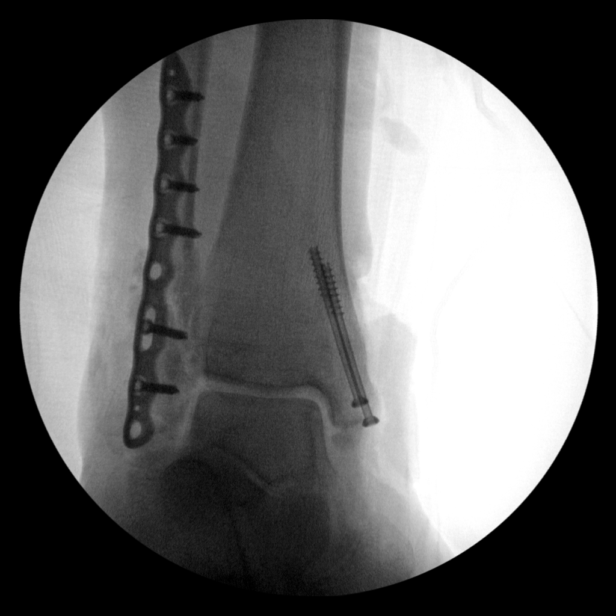
[im 5/13]
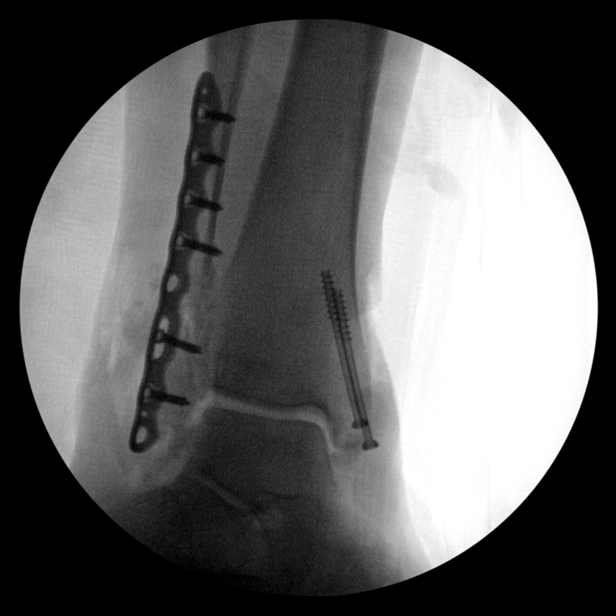
[im 6/13]
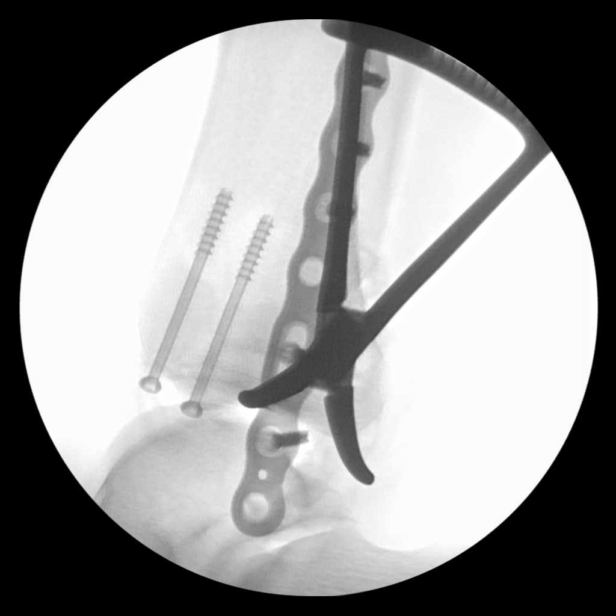
[im 7/13]
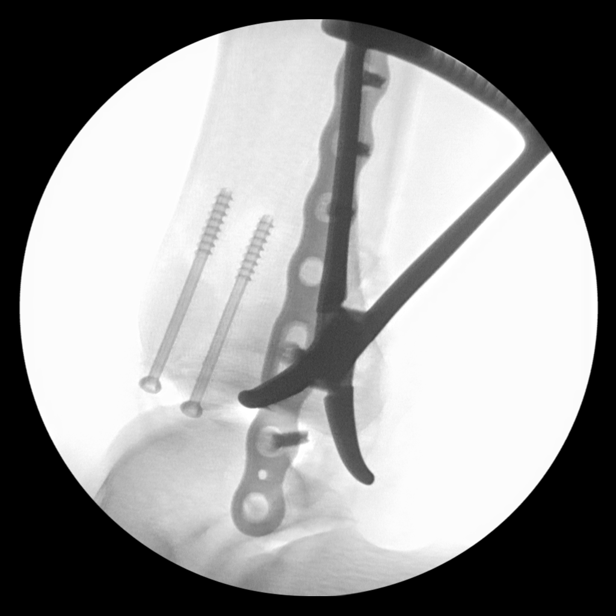
[im 8/13]
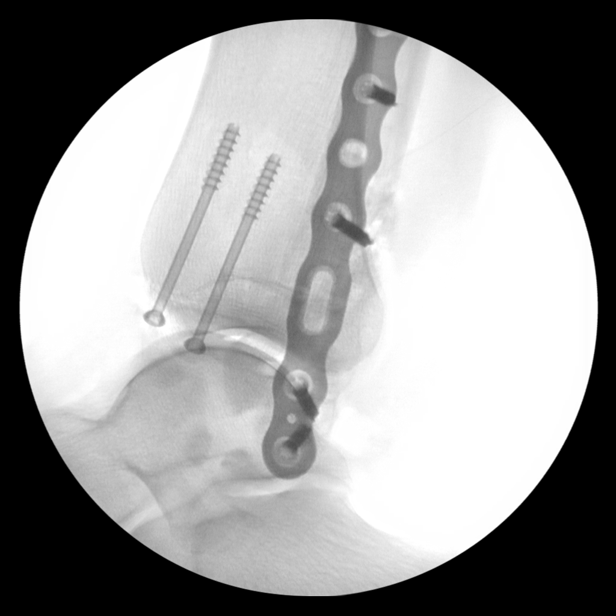
[im 9/13]
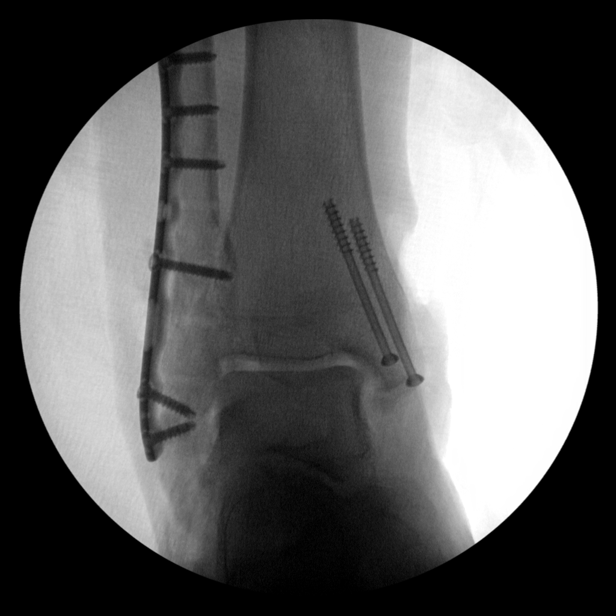
[im 10/13]
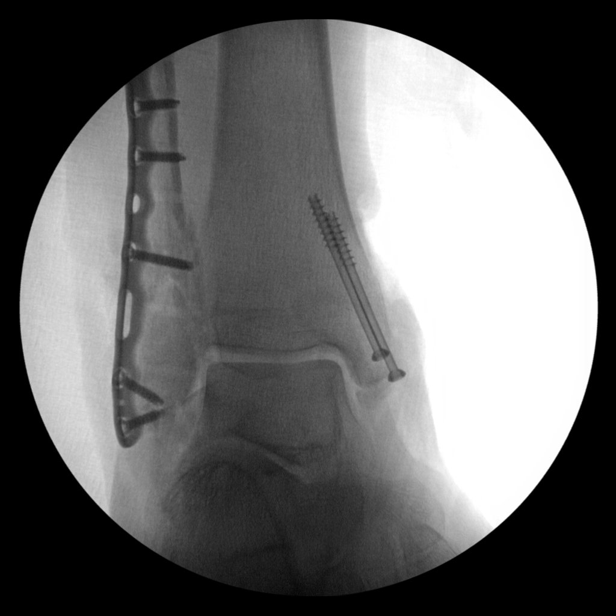
[im 11/13]
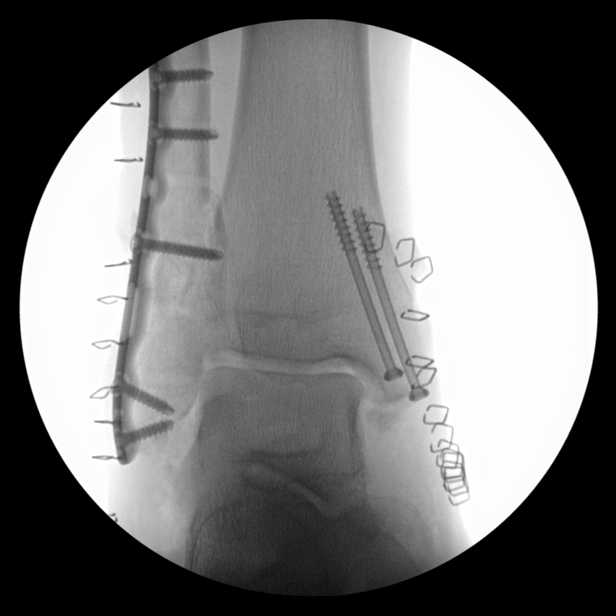
[im 12/13]
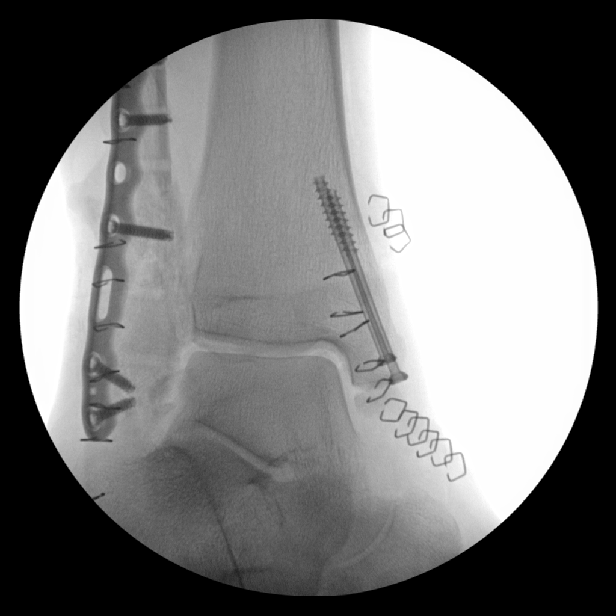
[im 13/13]
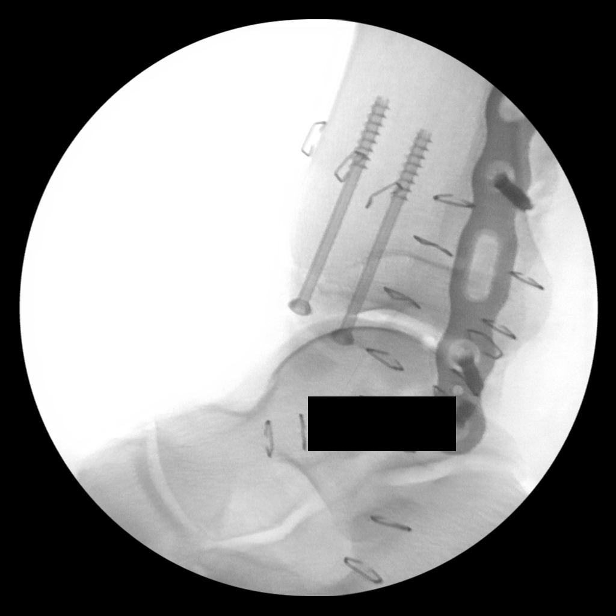

[13 of 13 positions shown; findings below may reference images not displayed]

FINDINGS: Twelve intraoperative fluoroscopic spot views during ORIF distal
tibia and fibular fractures. There is displacement of lateral plate
and screw fixation of distal fibular fractures. Two partially
cannulated screws traverse the medial malleolar fracture. Fractures
are in improved alignment compared to preoperative imaging. Skin
staples are noted. Fluoroscopy time 89.2 seconds. Cumulative dose
6.22 mGy.
IMPRESSION: Procedural fluoroscopy during post ORIF distal tibia and fibular
fractures.
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
## 2018-12-05 ENCOUNTER — Telehealth: Payer: Self-pay | Admitting: Orthopedic Surgery

## 2018-12-05 NOTE — Telephone Encounter (Signed)
Call from [place] [facility] at [facility], ph#[phone], requesting verbal orders to continue wound care for Right foot, 2 times per week for 5 weeks; states wound is now 1.4 x 0.4.  Please leave message on secure voice mail if she is unable to answer directly.

## 2018-12-08 NOTE — Telephone Encounter (Signed)
Wound is closing, has been a year, called to give the verbal to continue.

## 2019-04-22 ENCOUNTER — Ambulatory Visit: Payer: Medicare Other | Admitting: Orthopedic Surgery

## 2019-06-03 ENCOUNTER — Encounter: Payer: Self-pay | Admitting: Orthopedic Surgery

## 2019-06-03 ENCOUNTER — Ambulatory Visit (INDEPENDENT_AMBULATORY_CARE_PROVIDER_SITE_OTHER): Payer: Medicare Other

## 2019-06-03 ENCOUNTER — Other Ambulatory Visit: Payer: Self-pay

## 2019-06-03 ENCOUNTER — Ambulatory Visit: Payer: Medicare Other | Admitting: Orthopedic Surgery

## 2019-06-03 VITALS — BP 122/78 | HR 50 | Temp 98.4°F | Ht 71.0 in | Wt 207.0 lb

## 2019-06-03 DIAGNOSIS — Z89421 Acquired absence of other right toe(s): Secondary | ICD-10-CM

## 2019-06-03 DIAGNOSIS — Z9889 Other specified postprocedural states: Secondary | ICD-10-CM | POA: Diagnosis not present

## 2019-06-03 DIAGNOSIS — Z8781 Personal history of (healed) traumatic fracture: Secondary | ICD-10-CM
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
## 2021-02-08 DIAGNOSIS — B070 Plantar wart: Secondary | ICD-10-CM | POA: Diagnosis not present

## 2021-02-15 DIAGNOSIS — E782 Mixed hyperlipidemia: Secondary | ICD-10-CM | POA: Diagnosis not present

## 2021-02-15 DIAGNOSIS — M1990 Unspecified osteoarthritis, unspecified site: Secondary | ICD-10-CM | POA: Diagnosis not present

## 2021-02-15 DIAGNOSIS — R7303 Prediabetes: Secondary | ICD-10-CM | POA: Diagnosis not present

## 2021-02-15 DIAGNOSIS — Z89421 Acquired absence of other right toe(s): Secondary | ICD-10-CM | POA: Diagnosis not present

## 2021-02-15 DIAGNOSIS — G250 Essential tremor: Secondary | ICD-10-CM | POA: Diagnosis not present

## 2021-02-15 DIAGNOSIS — Z720 Tobacco use: Secondary | ICD-10-CM | POA: Diagnosis not present

## 2021-02-15 DIAGNOSIS — I10 Essential (primary) hypertension: Secondary | ICD-10-CM | POA: Diagnosis not present

## 2021-02-15 DIAGNOSIS — Z0000 Encounter for general adult medical examination without abnormal findings: Secondary | ICD-10-CM | POA: Diagnosis not present

## 2021-07-27 DIAGNOSIS — B070 Plantar wart: Secondary | ICD-10-CM | POA: Diagnosis not present

## 2021-07-27 DIAGNOSIS — I872 Venous insufficiency (chronic) (peripheral): Secondary | ICD-10-CM | POA: Diagnosis not present

## 2021-08-16 DIAGNOSIS — I10 Essential (primary) hypertension: Secondary | ICD-10-CM | POA: Diagnosis not present

## 2021-08-16 DIAGNOSIS — Z89421 Acquired absence of other right toe(s): Secondary | ICD-10-CM | POA: Diagnosis not present

## 2021-08-16 DIAGNOSIS — B070 Plantar wart: Secondary | ICD-10-CM | POA: Diagnosis not present

## 2021-08-16 DIAGNOSIS — E782 Mixed hyperlipidemia: Secondary | ICD-10-CM | POA: Diagnosis not present

## 2021-08-16 DIAGNOSIS — R7303 Prediabetes: Secondary | ICD-10-CM | POA: Diagnosis not present

## 2021-08-16 DIAGNOSIS — Z720 Tobacco use: Secondary | ICD-10-CM | POA: Diagnosis not present

## 2021-10-09 DIAGNOSIS — B070 Plantar wart: Secondary | ICD-10-CM | POA: Diagnosis not present

## 2021-10-23 DIAGNOSIS — L84 Corns and callosities: Secondary | ICD-10-CM | POA: Diagnosis not present

## 2021-11-01 ENCOUNTER — Encounter: Payer: Self-pay | Admitting: Podiatry

## 2021-11-01 ENCOUNTER — Ambulatory Visit: Payer: Medicare Other | Admitting: Podiatry

## 2021-11-01 ENCOUNTER — Other Ambulatory Visit: Payer: Self-pay

## 2021-11-01 DIAGNOSIS — E8881 Metabolic syndrome: Secondary | ICD-10-CM | POA: Insufficient documentation

## 2021-11-01 DIAGNOSIS — M79671 Pain in right foot: Secondary | ICD-10-CM | POA: Insufficient documentation

## 2021-11-01 DIAGNOSIS — H61311 Acquired stenosis of right external ear canal secondary to trauma: Secondary | ICD-10-CM | POA: Insufficient documentation

## 2021-11-01 DIAGNOSIS — Z9189 Other specified personal risk factors, not elsewhere classified: Secondary | ICD-10-CM | POA: Insufficient documentation

## 2021-11-01 DIAGNOSIS — H903 Sensorineural hearing loss, bilateral: Secondary | ICD-10-CM | POA: Insufficient documentation

## 2021-11-01 DIAGNOSIS — R7303 Prediabetes: Secondary | ICD-10-CM | POA: Insufficient documentation

## 2021-11-01 DIAGNOSIS — L409 Psoriasis, unspecified: Secondary | ICD-10-CM | POA: Insufficient documentation

## 2021-11-01 DIAGNOSIS — M1993 Secondary osteoarthritis, unspecified site: Secondary | ICD-10-CM | POA: Insufficient documentation

## 2021-11-01 DIAGNOSIS — Z461 Encounter for fitting and adjustment of hearing aid: Secondary | ICD-10-CM | POA: Insufficient documentation

## 2021-11-01 DIAGNOSIS — Z0289 Encounter for other administrative examinations: Secondary | ICD-10-CM | POA: Insufficient documentation

## 2021-11-01 DIAGNOSIS — H906 Mixed conductive and sensorineural hearing loss, bilateral: Secondary | ICD-10-CM | POA: Insufficient documentation

## 2021-11-01 DIAGNOSIS — L989 Disorder of the skin and subcutaneous tissue, unspecified: Secondary | ICD-10-CM | POA: Diagnosis not present

## 2021-11-01 DIAGNOSIS — H6120 Impacted cerumen, unspecified ear: Secondary | ICD-10-CM | POA: Insufficient documentation

## 2021-11-03 NOTE — Progress Notes (Signed)
Subjective:  Patient ID: Russell Luna, male    DOB: 15-Oct-1946,  MRN: [id number]  Chief Complaint  Patient presents with   Callouses    Right foot callus/plantar wart PT stated that he has been treating since February     75 y.o. male presents with the above complaint.  Patient presents with complaint of right submetatarsal 4 porokeratotic lesion/benign skin lesion.  Patient states painful to touch painful to walk on has progressed to gotten worse.  He came out of nowhere.  Hurts when stepping on it the right way.  He is a prediabetic.  He is not on any medication.  He would like to have it removed.  He has not seen anyone else prior to seeing me.  He is tried shoe gear modification which has not helped.   Review of Systems: Negative except as noted in the HPI. Denies N/V/F/Ch.  Past Medical History:  Diagnosis Date   Arthritis    High cholesterol    Hypertension     Current Outpatient Medications:    atorvastatin (LIPITOR) 80 MG tablet, TAKE ONE TABLET BY MOUTH AT BEDTIME FOR CHOLESTEROL, Disp: , Rfl:    ciprofloxacin (CIPRO) 500 MG tablet, Take 1 tablet by mouth 2 (two) times daily., Disp: , Rfl:    ciprofloxacin-dexamethasone (CIPRODEX) OTIC suspension, INSTILL 4 DROPS IN RIGHT EAR TWO TIMES A DAY, Disp: , Rfl:    atorvastatin (LIPITOR) 80 MG tablet, Take 40 mg by mouth at bedtime. , Disp: , Rfl:    docusate sodium (COLACE) 100 MG capsule, Take 200 mg by mouth 2 (two) times daily as needed (for constipation.)., Disp: , Rfl:    gabapentin (NEURONTIN) 100 MG capsule, 2 tablets 3 times a day 100 mg, Disp: 180 capsule, Rfl: 2   hydrochlorothiazide (HYDRODIURIL) 25 MG tablet, Take 25 mg by mouth daily., Disp: , Rfl:    ibuprofen (ADVIL,MOTRIN) 400 MG tablet, Take 1 tablet (400 mg total) by mouth every 6 (six) hours., Disp: 30 tablet, Rfl: 0   lisinopril (PRINIVIL,ZESTRIL) 40 MG tablet, Take 20 mg by mouth daily. , Disp: , Rfl:    morphine (MS CONTIN) 30 MG 12 hr tablet, Take 30 mg by  mouth every 12 (twelve) hours., Disp: , Rfl:    morphine (MSIR) 15 MG tablet, Take 1 tablet (15 mg total) by mouth every 4 (four) hours as needed for severe pain., Disp: 12 tablet, Rfl: 0   nicotine (NICODERM CQ - DOSED IN MG/24 HOURS) 21 mg/24hr patch, Place 1 patch (21 mg total) onto the skin daily., Disp: 28 patch, Rfl: 0   polyethylene glycol powder (GLYCOLAX/MIRALAX) powder, Take 17 g by mouth daily as needed (for constipation.)., Disp: , Rfl:   Social History   Tobacco Use  Smoking Status Every Day   Packs/day: 0.25   Years: 50.00   Pack years: 12.50   Types: Cigarettes  Smokeless Tobacco Never  Tobacco Comments   trying to quit, down to 5 per day    No Known Allergies Objective:  There were no vitals filed for this visit. There is no height or weight on file to calculate BMI. Constitutional Well developed. Well nourished.  Vascular Dorsalis pedis pulses palpable bilaterally. Posterior tibial pulses palpable bilaterally. Capillary refill normal to all digits.  No cyanosis or clubbing noted. Pedal hair growth normal.  Neurologic Normal speech. Oriented to person, place, and time. Epicritic sensation to light touch grossly present bilaterally.  Dermatologic Right submetatarsal 4 hyperkeratotic lesion with central nucleated core  noted.  Pain on palpation to the lesion.  No pinpoint bleeding noted upon debridement  Orthopedic: Normal joint ROM without pain or crepitus bilaterally. No visible deformities. No bony tenderness.   Radiographs: None Assessment:   1. Benign skin lesion    Plan:  Patient was evaluated and treated and all questions answered.  Right submetatarsal 4 porokeratotic lesion/benign skin lesion -I explained to the patient the etiology of porokeratosis and various treatment options were extensively discussed.  Given the amount of pain that he is having he will benefit from debridement of the lesion followed by offloading pads.  Patient agrees with the  plan.  Using chisel blade to handle the lesion was debrided down to healthy scar tissue no complication noted. -If there is no improvement we will discuss punch excision of the lesion.  No follow-ups on file.

## 2021-12-13 ENCOUNTER — Ambulatory Visit: Payer: Medicare Other | Admitting: Podiatry

## 2021-12-13 ENCOUNTER — Other Ambulatory Visit: Payer: Self-pay

## 2021-12-13 DIAGNOSIS — L989 Disorder of the skin and subcutaneous tissue, unspecified: Secondary | ICD-10-CM

## 2021-12-14 ENCOUNTER — Encounter: Payer: Self-pay | Admitting: Podiatry

## 2021-12-14 NOTE — Progress Notes (Signed)
Subjective:  Patient ID: Russell Luna, male    DOB: 28-Sep-1946,  MRN: [id number]  Chief Complaint  Patient presents with   Callouses    Right foot     75 y.o. male presents with the above complaint.  Patient presents with complaint of right submetatarsal 4 porokeratotic lesion/benign skin lesion.  He states he still continues to hurt is painful to walk on.  Is got a little bit better however he would like to have it completely removed.  He denies any other acute complaints.  Review of Systems: Negative except as noted in the HPI. Denies N/V/F/Ch.  Past Medical History:  Diagnosis Date   Arthritis    High cholesterol    Hypertension     Current Outpatient Medications:    atorvastatin (LIPITOR) 80 MG tablet, Take 40 mg by mouth at bedtime. , Disp: , Rfl:    atorvastatin (LIPITOR) 80 MG tablet, TAKE ONE TABLET BY MOUTH AT BEDTIME FOR CHOLESTEROL, Disp: , Rfl:    ciprofloxacin (CIPRO) 500 MG tablet, Take 1 tablet by mouth 2 (two) times daily., Disp: , Rfl:    ciprofloxacin-dexamethasone (CIPRODEX) OTIC suspension, INSTILL 4 DROPS IN RIGHT EAR TWO TIMES A DAY, Disp: , Rfl:    docusate sodium (COLACE) 100 MG capsule, Take 200 mg by mouth 2 (two) times daily as needed (for constipation.)., Disp: , Rfl:    gabapentin (NEURONTIN) 100 MG capsule, 2 tablets 3 times a day 100 mg, Disp: 180 capsule, Rfl: 2   hydrochlorothiazide (HYDRODIURIL) 25 MG tablet, Take 25 mg by mouth daily., Disp: , Rfl:    ibuprofen (ADVIL,MOTRIN) 400 MG tablet, Take 1 tablet (400 mg total) by mouth every 6 (six) hours., Disp: 30 tablet, Rfl: 0   lisinopril (PRINIVIL,ZESTRIL) 40 MG tablet, Take 20 mg by mouth daily. , Disp: , Rfl:    morphine (MS CONTIN) 30 MG 12 hr tablet, Take 30 mg by mouth every 12 (twelve) hours., Disp: , Rfl:    morphine (MSIR) 15 MG tablet, Take 1 tablet (15 mg total) by mouth every 4 (four) hours as needed for severe pain., Disp: 12 tablet, Rfl: 0   nicotine (NICODERM CQ - DOSED IN MG/24  HOURS) 21 mg/24hr patch, Place 1 patch (21 mg total) onto the skin daily., Disp: 28 patch, Rfl: 0   polyethylene glycol powder (GLYCOLAX/MIRALAX) powder, Take 17 g by mouth daily as needed (for constipation.)., Disp: , Rfl:   Social History   Tobacco Use  Smoking Status Every Day   Packs/day: 0.25   Years: 50.00   Pack years: 12.50   Types: Cigarettes  Smokeless Tobacco Never  Tobacco Comments   trying to quit, down to 5 per day    No Known Allergies Objective:  There were no vitals filed for this visit. There is no height or weight on file to calculate BMI. Constitutional Well developed. Well nourished.  Vascular Dorsalis pedis pulses palpable bilaterally. Posterior tibial pulses palpable bilaterally. Capillary refill normal to all digits.  No cyanosis or clubbing noted. Pedal hair growth normal.  Neurologic Normal speech. Oriented to person, place, and time. Epicritic sensation to light touch grossly present bilaterally.  Dermatologic Right submetatarsal 4 hyperkeratotic lesion with central nucleated core noted.  Pain on palpation to the lesion.  No pinpoint bleeding noted upon debridement  Orthopedic: Normal joint ROM without pain or crepitus bilaterally. No visible deformities. No bony tenderness.   Radiographs: None Assessment:   1. Benign skin lesion     Plan:  Patient was evaluated and treated and all questions answered.  Right submetatarsal 4 porokeratotic lesion/benign skin lesion -Clinically continues to keep coming back.  At this time I discussed punch excision of the lesion to take it out completely in its entirety.  Patient would like to proceed with that.  I discussed my procedure in extensive detail he states understand like to proceed with that. -Excision of the skin lesion Skin was prepped in standard technique with Betadine.  One-to-one mixture of 1% lidocaine plain half percent Marcaine plain was injected circumferentially in V-block fashion 3 cc.  3  mm punch biopsy was utilized to remove the lesion in its entirety down to the level of the subcutaneous tissue.  The wound site was dressed with triple antibiotic 4 x 4 gauze Kerlix and Ace bandage.  I have asked the patient to keep it covered with Neosporin and a Band-Aid until healing.  I will see her back again in 6 weeks   No follow-ups on file.

## 2022-01-24 ENCOUNTER — Other Ambulatory Visit: Payer: Self-pay

## 2022-01-24 ENCOUNTER — Ambulatory Visit: Payer: Medicare Other | Admitting: Podiatry

## 2022-01-24 DIAGNOSIS — L989 Disorder of the skin and subcutaneous tissue, unspecified: Secondary | ICD-10-CM | POA: Diagnosis not present

## 2022-01-24 DIAGNOSIS — M216X1 Other acquired deformities of right foot: Secondary | ICD-10-CM

## 2022-01-24 NOTE — Progress Notes (Signed)
Subjective:  Patient ID: Russell Luna, male    DOB: 11-26-1946,  MRN: [id number]  Chief Complaint  Patient presents with   Callouses    Right foot     76 y.o. male presents with the above complaint.  Patient presents with complaint of right submetatarsal 4 porokeratotic lesion/benign skin lesion.  He states he is doing well.  He denies any other acute complaints.  Review of Systems: Negative except as noted in the HPI. Denies N/V/F/Ch.  Past Medical History:  Diagnosis Date   Arthritis    High cholesterol    Hypertension     Current Outpatient Medications:    atorvastatin (LIPITOR) 80 MG tablet, Take 40 mg by mouth at bedtime. , Disp: , Rfl:    atorvastatin (LIPITOR) 80 MG tablet, TAKE ONE TABLET BY MOUTH AT BEDTIME FOR CHOLESTEROL, Disp: , Rfl:    ciprofloxacin (CIPRO) 500 MG tablet, Take 1 tablet by mouth 2 (two) times daily., Disp: , Rfl:    ciprofloxacin-dexamethasone (CIPRODEX) OTIC suspension, INSTILL 4 DROPS IN RIGHT EAR TWO TIMES A DAY, Disp: , Rfl:    docusate sodium (COLACE) 100 MG capsule, Take 200 mg by mouth 2 (two) times daily as needed (for constipation.)., Disp: , Rfl:    gabapentin (NEURONTIN) 100 MG capsule, 2 tablets 3 times a day 100 mg, Disp: 180 capsule, Rfl: 2   hydrochlorothiazide (HYDRODIURIL) 25 MG tablet, Take 25 mg by mouth daily., Disp: , Rfl:    ibuprofen (ADVIL,MOTRIN) 400 MG tablet, Take 1 tablet (400 mg total) by mouth every 6 (six) hours., Disp: 30 tablet, Rfl: 0   lisinopril (PRINIVIL,ZESTRIL) 40 MG tablet, Take 20 mg by mouth daily. , Disp: , Rfl:    morphine (MS CONTIN) 30 MG 12 hr tablet, Take 30 mg by mouth every 12 (twelve) hours., Disp: , Rfl:    morphine (MSIR) 15 MG tablet, Take 1 tablet (15 mg total) by mouth every 4 (four) hours as needed for severe pain., Disp: 12 tablet, Rfl: 0   nicotine (NICODERM CQ - DOSED IN MG/24 HOURS) 21 mg/24hr patch, Place 1 patch (21 mg total) onto the skin daily., Disp: 28 patch, Rfl: 0   polyethylene  glycol powder (GLYCOLAX/MIRALAX) powder, Take 17 g by mouth daily as needed (for constipation.)., Disp: , Rfl:   Social History   Tobacco Use  Smoking Status Every Day   Packs/day: 0.25   Years: 50.00   Pack years: 12.50   Types: Cigarettes  Smokeless Tobacco Never  Tobacco Comments   trying to quit, down to 5 per day    No Known Allergies Objective:  There were no vitals filed for this visit. There is no height or weight on file to calculate BMI. Constitutional Well developed. Well nourished.  Vascular Dorsalis pedis pulses palpable bilaterally. Posterior tibial pulses palpable bilaterally. Capillary refill normal to all digits.  No cyanosis or clubbing noted. Pedal hair growth normal.  Neurologic Normal speech. Oriented to person, place, and time. Epicritic sensation to light touch grossly present bilaterally.  Dermatologic Right submetatarsal 4 hyperkeratotic lesion with central nucleated core noted.  Pain on palpation to the lesion.  No pinpoint bleeding noted upon debridement plantarflexed metatarsal noted to the right submetatarsal 4  Orthopedic: Normal joint ROM without pain or crepitus bilaterally. No visible deformities. No bony tenderness.   Radiographs: None Assessment:   1. Benign skin lesion   2. Plantar flexed metatarsal, right      Plan:  Patient was evaluated and treated and  all questions answered.  Right submetatarsal 4 porokeratotic lesion/benign skin lesion with underlying plantarflexed metatarsal -Clinically doing much better.  His pain is improved considerably. -He is still placing a lot of pressure to the submetatarsal 4 likely due to underlying plantarflexed metatarsal.  I discussed with him if this reoccurs or continues to hurt him we can discuss doing a floating osteotomy in the near future.  He states he will think about it and return to me when he is ready to have that done.   No follow-ups on file.

## 2022-02-16 ENCOUNTER — Other Ambulatory Visit: Payer: Self-pay

## 2022-02-16 ENCOUNTER — Ambulatory Visit: Payer: Medicare Other | Admitting: Podiatry

## 2022-02-16 ENCOUNTER — Telehealth: Payer: Self-pay | Admitting: Urology

## 2022-02-16 ENCOUNTER — Ambulatory Visit (INDEPENDENT_AMBULATORY_CARE_PROVIDER_SITE_OTHER): Payer: Medicare Other

## 2022-02-16 DIAGNOSIS — L989 Disorder of the skin and subcutaneous tissue, unspecified: Secondary | ICD-10-CM | POA: Diagnosis not present

## 2022-02-16 DIAGNOSIS — M216X1 Other acquired deformities of right foot: Secondary | ICD-10-CM | POA: Diagnosis not present

## 2022-02-16 DIAGNOSIS — Z01818 Encounter for other preprocedural examination: Secondary | ICD-10-CM

## 2022-02-16 NOTE — Telephone Encounter (Signed)
DOS - 03/12/22  METATARSAL OSTEOTOMY 4TH RIGHT --- 28[address]   [facility] EFFECTIVE DATE - 12/24/21  PLAN DEDUCTIBLE - $0.00  OUT OF POCKET - $4,500.00 W/ $4,470.00 REMAINING COINSURANCE - 0% COPAY - $325.00  PER [employer] WEBSITE FOR CPT CODE 79024 Notification or Prior Authorization is not required for the requested services  Decision ID #:[id number]

## 2022-02-20 DIAGNOSIS — I10 Essential (primary) hypertension: Secondary | ICD-10-CM | POA: Diagnosis not present

## 2022-02-20 DIAGNOSIS — M1990 Unspecified osteoarthritis, unspecified site: Secondary | ICD-10-CM | POA: Diagnosis not present

## 2022-02-20 DIAGNOSIS — E782 Mixed hyperlipidemia: Secondary | ICD-10-CM | POA: Diagnosis not present

## 2022-02-20 DIAGNOSIS — Z0000 Encounter for general adult medical examination without abnormal findings: Secondary | ICD-10-CM | POA: Diagnosis not present

## 2022-02-20 DIAGNOSIS — Z720 Tobacco use: Secondary | ICD-10-CM | POA: Diagnosis not present

## 2022-02-20 DIAGNOSIS — R7303 Prediabetes: Secondary | ICD-10-CM | POA: Diagnosis not present

## 2022-02-20 DIAGNOSIS — G250 Essential tremor: Secondary | ICD-10-CM | POA: Diagnosis not present

## 2022-02-20 DIAGNOSIS — Z89421 Acquired absence of other right toe(s): Secondary | ICD-10-CM | POA: Diagnosis not present

## 2022-02-22 NOTE — Progress Notes (Signed)
Subjective:  Patient ID: Russell Luna, male    DOB: Apr 17, 1946,  MRN: [id number]  Chief Complaint  Patient presents with   Callouses    Right foot     76 y.o. male presents with the above complaint.  Patient presents with complaint of right submetatarsal 4 porokeratotic lesion/benign skin lesion.  He states it came back and is causing him a lot of pain.  He is very due undergo surgery given that he has failed all conservative treatment options.  Review of Systems: Negative except as noted in the HPI. Denies N/V/F/Ch.  Past Medical History:  Diagnosis Date   Arthritis    High cholesterol    Hypertension     Current Outpatient Medications:    atorvastatin (LIPITOR) 80 MG tablet, Take 40 mg by mouth at bedtime. , Disp: , Rfl:    atorvastatin (LIPITOR) 80 MG tablet, TAKE ONE TABLET BY MOUTH AT BEDTIME FOR CHOLESTEROL, Disp: , Rfl:    ciprofloxacin (CIPRO) 500 MG tablet, Take 1 tablet by mouth 2 (two) times daily., Disp: , Rfl:    ciprofloxacin-dexamethasone (CIPRODEX) OTIC suspension, INSTILL 4 DROPS IN RIGHT EAR TWO TIMES A DAY, Disp: , Rfl:    docusate sodium (COLACE) 100 MG capsule, Take 200 mg by mouth 2 (two) times daily as needed (for constipation.)., Disp: , Rfl:    gabapentin (NEURONTIN) 100 MG capsule, 2 tablets 3 times a day 100 mg, Disp: 180 capsule, Rfl: 2   hydrochlorothiazide (HYDRODIURIL) 25 MG tablet, Take 25 mg by mouth daily., Disp: , Rfl:    ibuprofen (ADVIL,MOTRIN) 400 MG tablet, Take 1 tablet (400 mg total) by mouth every 6 (six) hours., Disp: 30 tablet, Rfl: 0   lisinopril (PRINIVIL,ZESTRIL) 40 MG tablet, Take 20 mg by mouth daily. , Disp: , Rfl:    morphine (MS CONTIN) 30 MG 12 hr tablet, Take 30 mg by mouth every 12 (twelve) hours., Disp: , Rfl:    morphine (MSIR) 15 MG tablet, Take 1 tablet (15 mg total) by mouth every 4 (four) hours as needed for severe pain., Disp: 12 tablet, Rfl: 0   nicotine (NICODERM CQ - DOSED IN MG/24 HOURS) 21 mg/24hr patch, Place 1  patch (21 mg total) onto the skin daily., Disp: 28 patch, Rfl: 0   polyethylene glycol powder (GLYCOLAX/MIRALAX) powder, Take 17 g by mouth daily as needed (for constipation.)., Disp: , Rfl:   Social History   Tobacco Use  Smoking Status Every Day   Packs/day: 0.25   Years: 50.00   Pack years: 12.50   Types: Cigarettes  Smokeless Tobacco Never  Tobacco Comments   trying to quit, down to 5 per day    No Known Allergies Objective:  There were no vitals filed for this visit. There is no height or weight on file to calculate BMI. Constitutional Well developed. Well nourished.  Vascular Dorsalis pedis pulses palpable bilaterally. Posterior tibial pulses palpable bilaterally. Capillary refill normal to all digits.  No cyanosis or clubbing noted. Pedal hair growth normal.  Neurologic Normal speech. Oriented to person, place, and time. Epicritic sensation to light touch grossly present bilaterally.  Dermatologic Right submetatarsal 4 hyperkeratotic lesion with central nucleated core noted.  Pain on palpation to the lesion.  No pinpoint bleeding noted upon debridement plantarflexed metatarsal noted to the right submetatarsal 4  Orthopedic: Normal joint ROM without pain or crepitus bilaterally. No visible deformities. No bony tenderness.   Radiographs: None Assessment:   1. Benign skin lesion   2. Plantar  flexed metatarsal, right   3. Encounter for preoperative examination for general surgical procedure      Plan:  Patient was evaluated and treated and all questions answered.  Right submetatarsal 4 porokeratotic lesion/benign skin lesion with underlying plantarflexed metatarsal -Clinically it seems like it keeps coming back and is causing him a lot of pain.  Debridement and offloading padding protecting has failed.  Given the patient has failed all conservative treatment options he will benefit from a metatarsal osteotomy of the fourth to take the pressure away from the plantar  side.  I discussed this with the patient and patient states understand like to proceed with surgery.  I discussed my preoperative intraoperative postoperative plan in extensive detail he states understanding. -He will be weightbearing as tolerated in surgical shoe -Informed surgical risk consent was reviewed and read aloud to the patient.  I reviewed the films.  I have discussed my findings with the patient in great detail.  I have discussed all risks including but not limited to infection, stiffness, scarring, limp, disability, deformity, damage to blood vessels and nerves, numbness, poor healing, need for braces, arthritis, chronic pain, amputation, death.  All benefits and realistic expectations discussed in great detail.  I have made no promises as to the outcome.  I have provided realistic expectations.  I have offered the patient a 2nd opinion, which they have declined and assured me they preferred to proceed despite the risks    No follow-ups on file.

## 2022-03-12 ENCOUNTER — Other Ambulatory Visit: Payer: Self-pay | Admitting: Podiatry

## 2022-03-12 ENCOUNTER — Encounter: Payer: Self-pay | Admitting: Podiatry

## 2022-03-12 DIAGNOSIS — M205X1 Other deformities of toe(s) (acquired), right foot: Secondary | ICD-10-CM | POA: Diagnosis not present

## 2022-03-12 DIAGNOSIS — M21541 Acquired clubfoot, right foot: Secondary | ICD-10-CM | POA: Diagnosis not present

## 2022-03-12 MED ORDER — OXYCODONE-ACETAMINOPHEN 5-325 MG PO TABS: 1.0000 | ORAL_TABLET | ORAL | 0 refills | Status: DC | PRN

## 2022-03-12 MED ORDER — IBUPROFEN 800 MG PO TABS: 800.0000 mg | ORAL_TABLET | Freq: Four times a day (QID) | ORAL | 1 refills | Status: AC | PRN

## 2022-03-21 ENCOUNTER — Ambulatory Visit (INDEPENDENT_AMBULATORY_CARE_PROVIDER_SITE_OTHER): Payer: Medicare Other | Admitting: Podiatry

## 2022-03-21 ENCOUNTER — Ambulatory Visit (INDEPENDENT_AMBULATORY_CARE_PROVIDER_SITE_OTHER): Payer: Medicare Other

## 2022-03-21 ENCOUNTER — Other Ambulatory Visit: Payer: Self-pay

## 2022-03-21 DIAGNOSIS — M216X1 Other acquired deformities of right foot: Secondary | ICD-10-CM | POA: Diagnosis not present

## 2022-03-21 DIAGNOSIS — Z9889 Other specified postprocedural states: Secondary | ICD-10-CM

## 2022-03-21 NOTE — Progress Notes (Signed)
?Subjective:  ?Patient ID: Russell Luna, male    DOB: 03-Jun-1946,  MRN: [id number] ? ?No chief complaint on file. ? ? ?DOS: 03/12/2022 ?Procedure: Right fourth metatarsal floating osteotomy ? ?76 y.o. male returns for post-op check.  Patient states that he is doing well.  No pain.  He has been walking around with a surgical shoe.  Bandages clean dry and intact. ? ?Review of Systems: Negative except as noted in the HPI. Denies N/V/F/Ch. ? ?Past Medical History:  ?Diagnosis Date  ? Arthritis   ? High cholesterol   ? Hypertension   ? ? ?Current Outpatient Medications:  ?  atorvastatin (LIPITOR) 80 MG tablet, Take 40 mg by mouth at bedtime. , Disp: , Rfl:  ?  atorvastatin (LIPITOR) 80 MG tablet, TAKE ONE TABLET BY MOUTH AT BEDTIME FOR CHOLESTEROL, Disp: , Rfl:  ?  ciprofloxacin (CIPRO) 500 MG tablet, Take 1 tablet by mouth 2 (two) times daily., Disp: , Rfl:  ?  ciprofloxacin-dexamethasone (CIPRODEX) OTIC suspension, INSTILL 4 DROPS IN RIGHT EAR TWO TIMES A DAY, Disp: , Rfl:  ?  docusate sodium (COLACE) 100 MG capsule, Take 200 mg by mouth 2 (two) times daily as needed (for constipation.)., Disp: , Rfl:  ?  gabapentin (NEURONTIN) 100 MG capsule, 2 tablets 3 times a day 100 mg, Disp: 180 capsule, Rfl: 2 ?  hydrochlorothiazide (HYDRODIURIL) 25 MG tablet, Take 25 mg by mouth daily., Disp: , Rfl:  ?  ibuprofen (ADVIL) 800 MG tablet, Take 1 tablet (800 mg total) by mouth every 6 (six) hours as needed., Disp: 60 tablet, Rfl: 1 ?  ibuprofen (ADVIL,MOTRIN) 400 MG tablet, Take 1 tablet (400 mg total) by mouth every 6 (six) hours., Disp: 30 tablet, Rfl: 0 ?  lisinopril (PRINIVIL,ZESTRIL) 40 MG tablet, Take 20 mg by mouth daily. , Disp: , Rfl:  ?  morphine (MS CONTIN) 30 MG 12 hr tablet, Take 30 mg by mouth every 12 (twelve) hours., Disp: , Rfl:  ?  morphine (MSIR) 15 MG tablet, Take 1 tablet (15 mg total) by mouth every 4 (four) hours as needed for severe pain., Disp: 12 tablet, Rfl: 0 ?  nicotine (NICODERM CQ - DOSED IN MG/24  HOURS) 21 mg/24hr patch, Place 1 patch (21 mg total) onto the skin daily., Disp: 28 patch, Rfl: 0 ?  oxyCODONE-acetaminophen (PERCOCET) 5-325 MG tablet, Take 1 tablet by mouth every 4 (four) hours as needed for severe pain., Disp: 30 tablet, Rfl: 0 ?  polyethylene glycol powder (GLYCOLAX/MIRALAX) powder, Take 17 g by mouth daily as needed (for constipation.)., Disp: , Rfl:  ? ?Social History  ? ?Tobacco Use  ?Smoking Status Every Day  ? Packs/day: 0.25  ? Years: 50.00  ? Pack years: 12.50  ? Types: Cigarettes  ?Smokeless Tobacco Never  ?Tobacco Comments  ? trying to quit, down to 5 per day  ? ? ?No Known Allergies ?Objective:  ?There were no vitals filed for this visit. ?There is no height or weight on file to calculate BMI. ?Constitutional Well developed. ?Well nourished.  ?Vascular Foot warm and well perfused. ?Capillary refill normal to all digits.   ?Neurologic Normal speech. ?Oriented to person, place, and time. ?Epicritic sensation to light touch grossly present bilaterally.  ?Dermatologic Skin healing well without signs of infection. Skin edges well coapted without signs of infection.  ?Orthopedic: Tenderness to palpation noted about the surgical site.  ? ?Radiographs: 3 views of skeletally mature the right foot: Floating osteotomy noted with metatarsal osteotomy.  Good  correction alignment noted. ?Assessment:  ? ?1. Plantar flexed metatarsal, right   ?2. Status post foot surgery   ? ?Plan:  ?Patient was evaluated and treated and all questions answered. ? ?S/p foot surgery right ?-Progressing as expected post-operatively. ?-XR: See above ?-WB Status: Weightbearing as tolerated in surgical shoe ?-Sutures: Intact.  No clinical signs of Deis is noted.  No complication noted. ?-Medications: None ?-Foot redressed. ? ?No follow-ups on file.  ?

## 2022-04-04 ENCOUNTER — Ambulatory Visit (INDEPENDENT_AMBULATORY_CARE_PROVIDER_SITE_OTHER): Payer: Medicare Other | Admitting: Podiatry

## 2022-04-04 DIAGNOSIS — Z9889 Other specified postprocedural states: Secondary | ICD-10-CM

## 2022-04-04 DIAGNOSIS — M216X1 Other acquired deformities of right foot: Secondary | ICD-10-CM

## 2022-04-04 NOTE — Progress Notes (Signed)
?Subjective:  ?Patient ID: Russell Luna, male    DOB: 1946/05/31,  MRN: [id number] ? ?Chief Complaint  ?Patient presents with  ? Routine Post Op  ?   POV #2 DOS 03/12/2022 RT FOOT METATARSAL OSTEOTOMY 4TH  ? ? ?DOS: 03/12/2022 ?Procedure: Right fourth metatarsal floating osteotomy ? ?76 y.o. male returns for post-op check.  Patient states that he is doing well.  No pain.  He has been walking around with a surgical shoe.  Bandages clean dry and intact. ? ?Review of Systems: Negative except as noted in the HPI. Denies N/V/F/Ch. ? ?Past Medical History:  ?Diagnosis Date  ? Arthritis   ? High cholesterol   ? Hypertension   ? ? ?Current Outpatient Medications:  ?  atorvastatin (LIPITOR) 80 MG tablet, Take 40 mg by mouth at bedtime. , Disp: , Rfl:  ?  atorvastatin (LIPITOR) 80 MG tablet, TAKE ONE TABLET BY MOUTH AT BEDTIME FOR CHOLESTEROL, Disp: , Rfl:  ?  ciprofloxacin (CIPRO) 500 MG tablet, Take 1 tablet by mouth 2 (two) times daily., Disp: , Rfl:  ?  ciprofloxacin-dexamethasone (CIPRODEX) OTIC suspension, INSTILL 4 DROPS IN RIGHT EAR TWO TIMES A DAY, Disp: , Rfl:  ?  docusate sodium (COLACE) 100 MG capsule, Take 200 mg by mouth 2 (two) times daily as needed (for constipation.)., Disp: , Rfl:  ?  gabapentin (NEURONTIN) 100 MG capsule, 2 tablets 3 times a day 100 mg, Disp: 180 capsule, Rfl: 2 ?  hydrochlorothiazide (HYDRODIURIL) 25 MG tablet, Take 25 mg by mouth daily., Disp: , Rfl:  ?  ibuprofen (ADVIL) 800 MG tablet, Take 1 tablet (800 mg total) by mouth every 6 (six) hours as needed., Disp: 60 tablet, Rfl: 1 ?  ibuprofen (ADVIL,MOTRIN) 400 MG tablet, Take 1 tablet (400 mg total) by mouth every 6 (six) hours., Disp: 30 tablet, Rfl: 0 ?  lisinopril (PRINIVIL,ZESTRIL) 40 MG tablet, Take 20 mg by mouth daily. , Disp: , Rfl:  ?  morphine (MS CONTIN) 30 MG 12 hr tablet, Take 30 mg by mouth every 12 (twelve) hours., Disp: , Rfl:  ?  morphine (MSIR) 15 MG tablet, Take 1 tablet (15 mg total) by mouth every 4 (four) hours as  needed for severe pain., Disp: 12 tablet, Rfl: 0 ?  nicotine (NICODERM CQ - DOSED IN MG/24 HOURS) 21 mg/24hr patch, Place 1 patch (21 mg total) onto the skin daily., Disp: 28 patch, Rfl: 0 ?  oxyCODONE-acetaminophen (PERCOCET) 5-325 MG tablet, Take 1 tablet by mouth every 4 (four) hours as needed for severe pain., Disp: 30 tablet, Rfl: 0 ?  polyethylene glycol powder (GLYCOLAX/MIRALAX) powder, Take 17 g by mouth daily as needed (for constipation.)., Disp: , Rfl:  ? ?Social History  ? ?Tobacco Use  ?Smoking Status Every Day  ? Packs/day: 0.25  ? Years: 50.00  ? Pack years: 12.50  ? Types: Cigarettes  ?Smokeless Tobacco Never  ?Tobacco Comments  ? trying to quit, down to 5 per day  ? ? ?No Known Allergies ?Objective:  ?There were no vitals filed for this visit. ?There is no height or weight on file to calculate BMI. ?Constitutional Well developed. ?Well nourished.  ?Vascular Foot warm and well perfused. ?Capillary refill normal to all digits.   ?Neurologic Normal speech. ?Oriented to person, place, and time. ?Epicritic sensation to light touch grossly present bilaterally.  ?Dermatologic Skin completely reepithelialized.  No signs of recurrence of Deis is noted.  Plantar lesion has improved  ?Orthopedic: No tenderness to palpation noted  about the surgical site.  ? ?Radiographs: 3 views of skeletally mature the right foot: Floating osteotomy noted with metatarsal osteotomy.  Good correction alignment noted. ?Assessment:  ? ?1. Plantar flexed metatarsal, right   ?2. Status post foot surgery   ? ? ?Plan:  ?Patient was evaluated and treated and all questions answered. ? ?S/p foot surgery right ?-Progressing as expected post-operatively. ?-XR: See above ?-WB Status: Weightbearing as tolerated in regular shoe ?-Sutures: Removed no clinical signs of Deis is noted.  No complication noted. ?-Medications: None ?-Patient is officially discharged from my care.  He is lesion has improved his pain has improved considerably. ?-He  will also benefit from orthopedic shoe given that he has a history of chronic fourth and fifth digit amputation.  I discussed with him that he can get the referral/prescription from [place]. ? ?No follow-ups on file.  ?

## 2022-04-13 ENCOUNTER — Ambulatory Visit (INDEPENDENT_AMBULATORY_CARE_PROVIDER_SITE_OTHER): Payer: No Typology Code available for payment source | Admitting: Podiatry

## 2022-04-13 ENCOUNTER — Encounter: Payer: Self-pay | Admitting: Podiatry

## 2022-04-13 DIAGNOSIS — Q667 Congenital pes cavus, unspecified foot: Secondary | ICD-10-CM

## 2022-04-13 DIAGNOSIS — M216X1 Other acquired deformities of right foot: Secondary | ICD-10-CM

## 2022-04-13 DIAGNOSIS — Z9889 Other specified postprocedural states: Secondary | ICD-10-CM

## 2022-04-16 ENCOUNTER — Telehealth: Payer: Self-pay | Admitting: Podiatry

## 2022-04-16 NOTE — Telephone Encounter (Signed)
Pts wife called wanting to get scheduled for orthotics with Arlys John for next week, and she said they were using his [employer] and I looked at referral and it is only for the office visits not for the DME purchase.  ?I explained that we would need a authorization for the dme (orthotic purchase) that the [place] [place] in [facility] as they have a prosthetic dept. I asked [facility] to see if the [facility] would send [place] the [facility] or if not let us know and we can get an rx sent to the prosthetic dept at the [facility] ?

## 2022-04-17 ENCOUNTER — Other Ambulatory Visit: Payer: Medicare Other

## 2022-04-19 NOTE — Progress Notes (Signed)
?Subjective:  ?Patient ID: Russell Luna, male    DOB: 02/13/1946,  MRN: [id number] ? ?Chief Complaint  ?Patient presents with  ? Foot Pain  ? ? ?DOS: 03/12/2022 ?Procedure: Right fourth metatarsal floating osteotomy ? ?76 y.o. male returns for post-op check.  Patient states that he is doing well.  No pain.  He has been able to transition to regular shoes.  He would like to get a new orthopedic shoe.  He denies any other acute complaints.  He has brought [place] paperwork for authorization ? ?Review of Systems: Negative except as noted in the HPI. Denies N/V/F/Ch. ? ?Past Medical History:  ?Diagnosis Date  ? Arthritis   ? High cholesterol   ? Hypertension   ? ? ?Current Outpatient Medications:  ?  atorvastatin (LIPITOR) 80 MG tablet, Take 40 mg by mouth at bedtime. , Disp: , Rfl:  ?  atorvastatin (LIPITOR) 80 MG tablet, TAKE ONE TABLET BY MOUTH AT BEDTIME FOR CHOLESTEROL, Disp: , Rfl:  ?  ciprofloxacin (CIPRO) 500 MG tablet, Take 1 tablet by mouth 2 (two) times daily., Disp: , Rfl:  ?  ciprofloxacin-dexamethasone (CIPRODEX) OTIC suspension, INSTILL 4 DROPS IN RIGHT EAR TWO TIMES A DAY, Disp: , Rfl:  ?  docusate sodium (COLACE) 100 MG capsule, Take 200 mg by mouth 2 (two) times daily as needed (for constipation.)., Disp: , Rfl:  ?  gabapentin (NEURONTIN) 100 MG capsule, 2 tablets 3 times a day 100 mg, Disp: 180 capsule, Rfl: 2 ?  hydrochlorothiazide (HYDRODIURIL) 25 MG tablet, Take 25 mg by mouth daily., Disp: , Rfl:  ?  ibuprofen (ADVIL) 800 MG tablet, Take 1 tablet (800 mg total) by mouth every 6 (six) hours as needed., Disp: 60 tablet, Rfl: 1 ?  ibuprofen (ADVIL,MOTRIN) 400 MG tablet, Take 1 tablet (400 mg total) by mouth every 6 (six) hours., Disp: 30 tablet, Rfl: 0 ?  lisinopril (PRINIVIL,ZESTRIL) 40 MG tablet, Take 20 mg by mouth daily. , Disp: , Rfl:  ?  morphine (MS CONTIN) 30 MG 12 hr tablet, Take 30 mg by mouth every 12 (twelve) hours., Disp: , Rfl:  ?  morphine (MSIR) 15 MG tablet, Take 1 tablet (15 mg total)  by mouth every 4 (four) hours as needed for severe pain., Disp: 12 tablet, Rfl: 0 ?  nicotine (NICODERM CQ - DOSED IN MG/24 HOURS) 21 mg/24hr patch, Place 1 patch (21 mg total) onto the skin daily., Disp: 28 patch, Rfl: 0 ?  oxyCODONE-acetaminophen (PERCOCET) 5-325 MG tablet, Take 1 tablet by mouth every 4 (four) hours as needed for severe pain., Disp: 30 tablet, Rfl: 0 ?  polyethylene glycol powder (GLYCOLAX/MIRALAX) powder, Take 17 g by mouth daily as needed (for constipation.)., Disp: , Rfl:  ? ?Social History  ? ?Tobacco Use  ?Smoking Status Every Day  ? Packs/day: 0.25  ? Years: 50.00  ? Pack years: 12.50  ? Types: Cigarettes  ?Smokeless Tobacco Never  ?Tobacco Comments  ? trying to quit, down to 5 per day  ? ? ?No Known Allergies ?Objective:  ?There were no vitals filed for this visit. ?There is no height or weight on file to calculate BMI. ?Constitutional Well developed. ?Well nourished.  ?Vascular Foot warm and well perfused. ?Capillary refill normal to all digits.   ?Neurologic Normal speech. ?Oriented to person, place, and time. ?Epicritic sensation to light touch grossly present bilaterally.  ?Dermatologic Skin completely reepithelialized.  No signs of recurrence of Deis is noted.  Plantar lesion has improved  ?Orthopedic:  No tenderness to palpation noted about the surgical site.  ? ?Radiographs: 3 views of skeletally mature the right foot: Floating osteotomy noted with metatarsal osteotomy.  Good correction alignment noted. ?Assessment:  ? ?No diagnosis found. ? ? ?Plan:  ?Patient was evaluated and treated and all questions answered. ? ?S/p foot surgery right ?-Progressing as expected post-operatively. ?-XR: See above ?-WB Status: Weightbearing as tolerated in regular shoe ?-Sutures: Removed no clinical signs of Deis is noted.  No complication noted. ?-Medications: None ?-Patient is officially discharged from my care.  He is lesion has improved his pain has improved considerably. ? ?Pes cavus ?-He has  gotten authorization from [place] for insoles.  I have made an appointment for with Arlys John for orthotics/insole.  This will also include offloading the right fourth metatarsal head. ? ?No follow-ups on file.  ?

## 2023-02-27 DIAGNOSIS — Z720 Tobacco use: Secondary | ICD-10-CM | POA: Diagnosis not present

## 2023-02-27 DIAGNOSIS — Z0000 Encounter for general adult medical examination without abnormal findings: Secondary | ICD-10-CM | POA: Diagnosis not present

## 2023-02-27 DIAGNOSIS — I10 Essential (primary) hypertension: Secondary | ICD-10-CM | POA: Diagnosis not present

## 2023-02-27 DIAGNOSIS — G250 Essential tremor: Secondary | ICD-10-CM | POA: Diagnosis not present

## 2023-02-27 DIAGNOSIS — M1990 Unspecified osteoarthritis, unspecified site: Secondary | ICD-10-CM | POA: Diagnosis not present

## 2023-06-18 DIAGNOSIS — R7303 Prediabetes: Secondary | ICD-10-CM | POA: Diagnosis not present

## 2023-09-27 DIAGNOSIS — I10 Essential (primary) hypertension: Secondary | ICD-10-CM | POA: Diagnosis not present

## 2023-09-27 DIAGNOSIS — Z23 Encounter for immunization: Secondary | ICD-10-CM | POA: Diagnosis not present

## 2023-09-27 DIAGNOSIS — Z9989 Dependence on other enabling machines and devices: Secondary | ICD-10-CM | POA: Diagnosis not present

## 2023-09-27 DIAGNOSIS — E782 Mixed hyperlipidemia: Secondary | ICD-10-CM | POA: Diagnosis not present

## 2023-09-27 DIAGNOSIS — E1169 Type 2 diabetes mellitus with other specified complication: Secondary | ICD-10-CM | POA: Diagnosis not present

## 2023-09-27 DIAGNOSIS — G250 Essential tremor: Secondary | ICD-10-CM | POA: Diagnosis not present

## 2023-09-27 DIAGNOSIS — E119 Type 2 diabetes mellitus without complications: Secondary | ICD-10-CM | POA: Diagnosis not present

## 2023-11-01 ENCOUNTER — Encounter: Payer: Self-pay | Admitting: Neurology

## 2023-11-11 NOTE — Progress Notes (Unsigned)
Assessment/Plan:   Tremor  -Has mixed characteristics.  He certainly has independent resting tremor on the left and right hand.  He also has characteristics of essential tremor.  It could be that essential tremor has been going on so long that he now has rest tremor.  However, given age, worsening of symptoms at rest and military service in [place] I will, I think it is important for [place] to help differentiate whether or not he is developing a primary parkinsonian syndrome or this is just advancing ET.  We are going to go ahead and schedule a DaTscan.  Patient was agreeable.  He asked several questions and I answered those to the best of my ability.  Further recommendations will follow above testing.  Subjective:   Russell Luna was seen today in the movement disorders clinic for neurologic consultation at the request of Toya Smothers, [place].  The consultation is for the evaluation of tremor.  Medical records from the [facility] that are available to me are reviewed.  Propranolol, 80 mg has been chronic for tremor, without relief.  Primidone was recommended in July, 2023 but it was declined at multiple visits, per [facility] medical records.  Ultimately, patient was sent here for further evaluation.  This patient is accompanied in the office by his spouse who supplements the history.  Tremor: Yes.     How long has it been going on? 12 years - started in the R hand initially but went to the L hand about 4 years ago  At rest or with activation?  both  Fam hx of tremor?  No.  Affected by caffeine:  No. (2 cups of coffee/ day)  Affected by alcohol:  doesn't drink any  Affected by stress:  No.  Affected by fatigue:  No.  Spills soup if on spoon:  Yes.    Spills glass of liquid if full:  Yes.    Other Specific Symptoms:  Voice: no change Sleep: sleeping well  Vivid Dreams:  No.  Acting out dreams:  No. Wet Pillows: No. 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  No. Bradykinesia symptoms: difficulty getting out of a chair (attributes to weak knees); feels like he shuffles some Loss of smell:  No. Loss of taste:  No. Urinary Incontinence:  No. But he has frequency Difficulty Swallowing:  No. Handwriting, micrographia: No. Trouble with ADL's:  No.  Trouble buttoning clothing: No. Depression:  No. But wife states that he can be "short with her" Memory changes:  wife thinks that this is a hearing problem N/V:  No. Lightheaded:  No.  Syncope: No. Diplopia:  No. Dyskinesia:  No.  Neuroimaging of the brain has not previously been performed as far as he knows but it may have been for some ear issue??.     ALLERGIES:   Allergies  Allergen Reactions   Naproxen Other (See Comments)    GI upset    CURRENT MEDICATIONS:  Current Outpatient Medications  Medication Instructions   atorvastatin (LIPITOR) 80 MG tablet TAKE ONE TABLET BY MOUTH AT BEDTIME FOR CHOLESTEROL   docusate sodium (COLACE) 200 mg, Oral, 2 times daily PRN   hydrochlorothiazide (HYDRODIURIL) 25 mg, Oral, Daily   ibuprofen (ADVIL) 400 mg, Oral, Every 6 hours   ibuprofen (ADVIL) 800 mg, Oral, Every 6 hours PRN   lisinopril (ZESTRIL) 20 mg, Oral, Daily  morphine (MS CONTIN) 30 mg, Oral, Every 12 hours   morphine (MSIR) 15 mg, Oral, Every 4 hours PRN   nicotine (NICODERM CQ - DOSED IN MG/24 HOURS) 21 mg, Transdermal, Daily   polyethylene glycol powder (GLYCOLAX/MIRALAX) 17 g, Oral, Daily PRN    Objective:   PHYSICAL EXAMINATION:    VITALS:   Vitals:   11/13/23 0950  BP: 134/70  Pulse: 78  SpO2: 96%  Weight: 216 lb 9.6 oz (98.2 kg)  Height: 5\' 11"  (1.803 m)    GEN:  The patient appears stated age and is in NAD. HEENT:  Normocephalic, atraumatic.  The mucous membranes are moist. The superficial temporal arteries are without ropiness or tenderness. CV:  RRR Lungs:  CTAB Neck/HEME:  There are no carotid  bruits bilaterally.  Neurological examination:  Orientation: The patient is alert and oriented x3.  Cranial nerves: There is good facial symmetry.  Extraocular muscles are intact. The visual fields are full to confrontational testing. The speech is fluent and clear. Soft palate rises symmetrically and there is no tongue deviation. Hearing is decreased to conversational tone. Sensation: Sensation is intact to light touch throughout (facial, trunk, extremities). Vibration is intact at the bilateral big toe. There is no extinction with double simultaneous stimulation.  Motor: Strength is 5/5 in the bilateral upper and lower extremities.   Shoulder shrug is equal and symmetric.  There is no pronator drift. Deep tendon reflexes: Deep tendon reflexes are 2+/4 at the bilateral biceps, triceps, brachioradialis, 3/4 at the bilateral patella and achilles. Plantar responses are downgoing bilaterally.  Movement examination: Tone: There is normal tone in the bilateral upper extremities.  The tone in the lower extremities is normal.  Abnormal movements: There is independent left and right upper extremity resting tremor.  It does not increase with distraction procedures.  He does not have postural tremor.  He does have intention tremor, mod in the wing beating position.  He has mild tremor with Archimedes spirals.     Coordination:  There is no decremation with RAM's, with any form of RAMS, including alternating supination and pronation of the forearm, hand opening and closing, finger taps, heel taps and toe taps.  Gait and Station: The patient ambulates well with his cane.  He is a bit antalgic.   He is short stepped and wide-based, but does not shuffle. I have reviewed and interpreted the following labs independently   Chemistry      Component Value Date/Time   NA 141 01/18/2018 0627   K 4.0 01/18/2018 0627   CL 100 (L) 01/18/2018 0627   CO2 30 01/18/2018 0627   BUN 14 01/18/2018 0627   CREATININE  0.87 01/18/2018 0627      Component Value Date/Time   CALCIUM 9.5 01/18/2018 0627   ALKPHOS 87 01/13/2018 1118   AST 20 01/13/2018 1118   ALT 17 01/13/2018 1118   BILITOT 1.0 01/13/2018 1118      No results found for: "TSH" Lab Results  Component Value Date   WBC 10.9 (H) 01/19/2018   HGB 13.2 01/19/2018   HCT 41.6 01/19/2018   MCV 96.3 01/19/2018   PLT 336 01/19/2018      Total time spent on today's visit was 60 minutes, including both face-to-face time and nonface-to-face time.  Time included that spent on review of records (prior notes available to me/labs/imaging if pertinent), discussing treatment and goals, answering patient's questions and coordinating care.  Cc:  Lupita Raider, MD

## 2023-11-13 ENCOUNTER — Ambulatory Visit (INDEPENDENT_AMBULATORY_CARE_PROVIDER_SITE_OTHER): Payer: Non-veteran care | Admitting: Neurology

## 2023-11-13 ENCOUNTER — Encounter: Payer: Self-pay | Admitting: Neurology

## 2023-11-13 VITALS — BP 134/70 | HR 78 | Ht 71.0 in | Wt 216.6 lb

## 2023-11-13 DIAGNOSIS — R251 Tremor, unspecified: Secondary | ICD-10-CM

## 2023-11-13 NOTE — Patient Instructions (Signed)

## 2024-01-10 ENCOUNTER — Emergency Department (HOSPITAL_COMMUNITY)
Admission: EM | Admit: 2024-01-10 | Discharge: 2024-01-10 | Disposition: A | Discharge status: Home / Self Care | Payer: No Typology Code available for payment source

## 2024-01-10 ENCOUNTER — Encounter (HOSPITAL_COMMUNITY): Payer: Self-pay | Admitting: *Deleted

## 2024-01-10 ENCOUNTER — Other Ambulatory Visit: Payer: Self-pay

## 2024-01-10 ENCOUNTER — Emergency Department (HOSPITAL_COMMUNITY): Payer: No Typology Code available for payment source

## 2024-01-10 DIAGNOSIS — A0839 Other viral enteritis: Secondary | ICD-10-CM | POA: Insufficient documentation

## 2024-01-10 DIAGNOSIS — K4090 Unilateral inguinal hernia, without obstruction or gangrene, not specified as recurrent: Secondary | ICD-10-CM | POA: Diagnosis not present

## 2024-01-10 DIAGNOSIS — E119 Type 2 diabetes mellitus without complications: Secondary | ICD-10-CM | POA: Diagnosis not present

## 2024-01-10 DIAGNOSIS — R1033 Periumbilical pain: Secondary | ICD-10-CM | POA: Diagnosis present

## 2024-01-10 DIAGNOSIS — R109 Unspecified abdominal pain: Secondary | ICD-10-CM | POA: Diagnosis not present

## 2024-01-10 DIAGNOSIS — N281 Cyst of kidney, acquired: Secondary | ICD-10-CM | POA: Diagnosis not present

## 2024-01-10 DIAGNOSIS — I10 Essential (primary) hypertension: Secondary | ICD-10-CM | POA: Insufficient documentation

## 2024-01-10 DIAGNOSIS — A084 Viral intestinal infection, unspecified: Secondary | ICD-10-CM

## 2024-01-10 LAB — COMPREHENSIVE METABOLIC PANEL
ALT: 60 U/L — ABNORMAL HIGH (ref 0–44)
AST: 34 U/L (ref 15–41)
Albumin: 3.8 g/dL (ref 3.5–5.0)
Alkaline Phosphatase: 73 U/L (ref 38–126)
Anion gap: 7 (ref 5–15)
BUN: 13 mg/dL (ref 8–23)
CO2: 28 mmol/L (ref 22–32)
Calcium: 9.3 mg/dL (ref 8.9–10.3)
Chloride: 99 mmol/L (ref 98–111)
Creatinine, Ser: 1.03 mg/dL (ref 0.61–1.24)
GFR, Estimated: >60 mL/min (ref >60)
Glucose, Bld: 104 mg/dL — ABNORMAL HIGH (ref 70–99)
Potassium: 3.5 mmol/L (ref 3.5–5.1)
Sodium: 134 mmol/L — ABNORMAL LOW (ref 135–145)
Total Bilirubin: 2 mg/dL — ABNORMAL HIGH (ref 0.0–1.2)
Total Protein: 7.7 g/dL (ref 6.5–8.1)

## 2024-01-10 LAB — URINALYSIS, ROUTINE W REFLEX MICROSCOPIC
Bacteria, UA: NONE SEEN
Bilirubin Urine: NEGATIVE
Glucose, UA: NEGATIVE mg/dL
Ketones, ur: NEGATIVE mg/dL
Leukocytes,Ua: NEGATIVE
Nitrite: NEGATIVE
Protein, ur: NEGATIVE mg/dL
Specific Gravity, Urine: 1.009 (ref 1.005–1.030)
pH: 6 (ref 5.0–8.0)

## 2024-01-10 LAB — CBC WITH DIFFERENTIAL/PLATELET
Abs Immature Granulocytes: 0.06 10*3/uL (ref 0.00–0.07)
Basophils Absolute: 0 10*3/uL (ref 0.0–0.1)
Basophils Relative: 0 %
Eosinophils Absolute: 0.1 10*3/uL (ref 0.0–0.5)
Eosinophils Relative: 1 %
HCT: 48.8 % (ref 39.0–52.0)
Hemoglobin: 16.3 g/dL (ref 13.0–17.0)
Immature Granulocytes: 1 %
Lymphocytes Relative: 15 %
Lymphs Abs: 1.7 10*3/uL (ref 0.7–4.0)
MCH: 31.3 pg (ref 26.0–34.0)
MCHC: 33.4 g/dL (ref 30.0–36.0)
MCV: 93.7 fL (ref 80.0–100.0)
Monocytes Absolute: 1.3 10*3/uL — ABNORMAL HIGH (ref 0.1–1.0)
Monocytes Relative: 11 %
Neutro Abs: 8.1 10*3/uL — ABNORMAL HIGH (ref 1.7–7.7)
Neutrophils Relative %: 72 %
Platelets: 256 10*3/uL (ref 150–400)
RBC: 5.21 MIL/uL (ref 4.22–5.81)
RDW: 13.3 % (ref 11.5–15.5)
WBC: 11.2 10*3/uL — ABNORMAL HIGH (ref 4.0–10.5)
nRBC: 0 % (ref 0.0–0.2)

## 2024-01-10 LAB — LIPASE, BLOOD: Lipase: 45 U/L (ref 11–51)

## 2024-01-10 MED ORDER — ONDANSETRON HCL 4 MG PO TABS: 4.0000 mg | ORAL_TABLET | Freq: Four times a day (QID) | ORAL | 0 refills | Status: AC

## 2024-01-10 MED ORDER — SODIUM CHLORIDE 0.9 % IV BOLUS
1000.0000 mL | Freq: Once | INTRAVENOUS | Status: AC
  Administered 2024-01-10: 1000 mL via INTRAVENOUS

## 2024-01-10 MED ORDER — IOHEXOL 300 MG/ML  SOLN
100.0000 mL | Freq: Once | INTRAMUSCULAR | Status: AC | PRN
  Administered 2024-01-10: 100 mL via INTRAVENOUS

## 2024-01-10 NOTE — Discharge Instructions (Addendum)
Rest and make sure you are drinking plenty of fluids.  Slowly increase your diet until you can tolerate a regular diet.  The BRAT diet is good for stomach upset - bananas, rice, applesauce, dry toast - or any other food that sounds appealing to you.    Stop taking your kaopectate unless your diarrhea returns.    I have prescribed you some zofran to take if your nausea returns as well.

## 2024-01-10 NOTE — ED Notes (Signed)
Patient ate graham crackers and drank water. Patient stated " I feel good and ready to go."  Informed PA.

## 2024-01-10 NOTE — ED Notes (Signed)
Patient given water and gram crackers.

## 2024-01-10 NOTE — ED Provider Notes (Signed)
[place] EMERGENCY DEPARTMENT AT [facility] Provider Note   CSN: [id number] Arrival date & time: 01/10/24  1008     History {Add pertinent medical, surgical, social history, OB history to HPI:1} Chief Complaint  Patient presents with   Weakness    EMITERIO NAU is a 78 y.o. male with a history including type 2 diabetes, hypertension, hypercholesterolemia presenting for evaluation of generalized weakness, abdominal pain, nausea vomiting and diarrhea which started 3 days ago.  He describes too many to count episodes of diarrhea which actually has resolved as of yesterday after taking doses of Kaopectate, his nausea and vomiting has also resolved but he continues to feel considerably weak and dehydrated.  Also has complaints of periumbilical cramping, denies abdominal distention and back pain.  He also states having shaking tremors in his legs, has a history of essential tremor in his arms but the leg tremors are new.  He has been able to tolerate fluid intake but has had very little food intake, he ate a little bit of chicken noodle soup yesterday and a few saltine crackers.  He denies fevers, dysuria, bloody stools.  He also denies chest pain or shortness of breath.  The history is provided by the patient.       Home Medications Prior to Admission medications   Medication Sig Start Date End Date Taking? Authorizing Provider  atorvastatin (LIPITOR) 80 MG tablet TAKE ONE TABLET BY MOUTH AT BEDTIME FOR CHOLESTEROL 02/16/21   [provider]  docusate sodium (COLACE) 100 MG capsule Take 200 mg by mouth 2 (two) times daily as needed (for constipation.).    [provider]  hydrochlorothiazide (HYDRODIURIL) 25 MG tablet Take 25 mg by mouth daily.    [provider]  ibuprofen (ADVIL) 800 MG tablet Take 1 tablet (800 mg total) by mouth every 6 (six) hours as needed. 03/12/22   Candelaria Stagers, DPM  ibuprofen (ADVIL,MOTRIN) 400 MG tablet Take 1 tablet (400  mg total) by mouth every 6 (six) hours. 12/13/17   Vickki Hearing, MD  lisinopril (PRINIVIL,ZESTRIL) 40 MG tablet Take 20 mg by mouth daily.     [provider]  morphine (MS CONTIN) 30 MG 12 hr tablet Take 30 mg by mouth every 12 (twelve) hours.    [provider]  morphine (MSIR) 15 MG tablet Take 1 tablet (15 mg total) by mouth every 4 (four) hours as needed for severe pain. 02/14/18   Vickki Hearing, MD  nicotine (NICODERM CQ - DOSED IN MG/24 HOURS) 21 mg/24hr patch Place 1 patch (21 mg total) onto the skin daily. Patient not taking: Reported on 11/13/2023 01/19/18   Vickki Hearing, MD  polyethylene glycol powder (GLYCOLAX/MIRALAX) powder Take 17 g by mouth daily as needed (for constipation.).    [provider]      Allergies    Naproxen    Review of Systems   Review of Systems  Constitutional:  Positive for fatigue. Negative for chills and fever.  HENT:  Negative for congestion and sore throat.   Eyes: Negative.   Respiratory:  Negative for chest tightness and shortness of breath.   Cardiovascular:  Negative for chest pain.  Gastrointestinal:  Positive for abdominal pain, diarrhea, nausea and vomiting. Negative for abdominal distention.  Genitourinary: Negative.   Musculoskeletal:  Negative for arthralgias, joint swelling and neck pain.  Skin: Negative.  Negative for rash and wound.  Neurological:  Positive for tremors and weakness. Negative for dizziness,  light-headedness, numbness and headaches.  Psychiatric/Behavioral: Negative.    All other systems reviewed and are negative.   Physical Exam Updated Vital Signs BP 128/88 (BP Location: Left Arm)   Pulse 75   Temp 97.7 F (36.5 C) (Oral)   Resp 18   Ht 5\' 11"  (1.803 m)   Wt 97.5 kg   SpO2 99%   BMI 29.99 kg/m  Physical Exam Vitals and nursing note reviewed.  Constitutional:      Appearance: He is well-developed.  HENT:     Head: Normocephalic and atraumatic.      Mouth/Throat:     Mouth: Mucous membranes are dry.  Eyes:     Conjunctiva/sclera: Conjunctivae normal.  Cardiovascular:     Rate and Rhythm: Normal rate and regular rhythm.     Heart sounds: Normal heart sounds.  Pulmonary:     Effort: Pulmonary effort is normal.     Breath sounds: Normal breath sounds. No wheezing.  Abdominal:     General: Bowel sounds are normal.     Palpations: Abdomen is soft.     Tenderness: There is abdominal tenderness in the periumbilical area. There is no guarding or rebound.     Comments: Mild periumbilical pain without guarding.  No acute abdominal findings  Musculoskeletal:        General: Normal range of motion.     Cervical back: Normal range of motion.  Skin:    General: Skin is warm and dry.  Neurological:     Mental Status: He is alert.     ED Results / Procedures / Treatments   Labs (all labs ordered are listed, but only abnormal results are displayed) Labs Reviewed - No data to display  EKG EKG Interpretation Date/Time:  Friday January 10 2024 10:56:48 EST Ventricular Rate:  63 PR Interval:  145 QRS Duration:  75 QT Interval:  380 QTC Calculation: 389 R Axis:   76  Text Interpretation: Sinus rhythm Atrial premature complexes Anteroseptal infarct, age indeterminate Confirmed by Estanislado Pandy [phone]) on 01/10/2024 11:10:49 AM  Radiology No results found.  Procedures Procedures  {Document cardiac monitor, telemetry assessment procedure when appropriate:1}  Medications Ordered in ED Medications - No data to display  ED Course/ Medical Decision Making/ A&P   {   Click here for ABCD2, HEART and other calculatorsREFRESH Note before signing :1}                              Medical Decision Making    {Document critical care time when appropriate:1} {Document review of labs and clinical decision tools ie heart score, Chads2Vasc2 etc:1}  {Document your independent review of radiology images, and any outside records:1} {Document  your discussion with family members, caretakers, and with consultants:1} {Document social determinants of health affecting pt's care:1} {Document your decision making why or why not admission, treatments were needed:1} Final Clinical Impression(s) / ED Diagnoses Final diagnoses:  None    Rx / DC Orders ED Discharge Orders     None

## 2024-01-10 NOTE — ED Triage Notes (Signed)
Pt reports generalized weakness, abdominal pain, n/v/d that started Tuesday. Wife reports he has finally been able to eat a few crackers in the last day, but he still very weak.

## 2024-02-07 ENCOUNTER — Ambulatory Visit (HOSPITAL_COMMUNITY)
Admission: RE | Admit: 2024-02-07 | Discharge: 2024-02-07 | Disposition: A | Discharge status: Home / Self Care | Payer: No Typology Code available for payment source | Source: Ambulatory Visit | Attending: Neurology | Admitting: Neurology

## 2024-02-07 DIAGNOSIS — R251 Tremor, unspecified: Secondary | ICD-10-CM | POA: Insufficient documentation

## 2024-02-07 MED ORDER — IOFLUPANE I 123 185 MBQ/2.5ML IV SOLN
4.5900 | Freq: Once | INTRAVENOUS | Status: AC | PRN
  Administered 2024-02-07: 4.59 via INTRAVENOUS

## 2024-02-07 MED ORDER — POTASSIUM IODIDE (ANTIDOTE) 130 MG PO TABS
ORAL_TABLET | ORAL | Status: AC
Start: 2024-02-07 — End: ?
  Filled 2024-02-07: qty 1

## 2024-02-11 NOTE — Progress Notes (Unsigned)
Virtual Visit Via Video    Consent was obtained for video visit:  {yes no:314532} Answered questions that patient had about telehealth interaction:  {yes no:314532} I discussed the limitations, risks, security and privacy concerns of performing an evaluation and management service by telemedicine. I also discussed with the patient that there may be a patient responsible charge related to this service. The patient expressed understanding and agreed to proceed.  Pt location: Home Physician Location: office Name of referring provider:  Lupita Raider, MD I connected with Russell Luna at patients initiation/request on 02/12/2024 at 11:15 AM EST by video enabled telemedicine application and verified that I am speaking with the correct person using two identifiers. Pt MRN:  [id number] Pt DOB:  April 09, 1946 Video Participants:  Russell Luna;  ***  Assessment/Plan:  Tremor *** -DaTscan completed February 07, 2024 was normal.  -Tremor with mixed characteristics.  However, given normal DaTscan, rest tremor is likely just due to longstanding essential tremor.  -Patient has been on propranolol, 80 mg without relief.  -Patient has previously been offered primidone but declined.  He and I discussed that again today.  Subjective   ***Patient seen today in follow-up for testing results.  Patient had DaTscan done and that was normal.  Current movement d/o meds:  ***   Current Outpatient Medications on File Prior to Visit  Medication Sig Dispense Refill   atorvastatin (LIPITOR) 80 MG tablet TAKE ONE TABLET BY MOUTH AT BEDTIME FOR CHOLESTEROL     docusate sodium (COLACE) 100 MG capsule Take 200 mg by mouth 2 (two) times daily as needed (for constipation.).     hydrochlorothiazide (HYDRODIURIL) 25 MG tablet Take 25 mg by mouth daily.     ibuprofen (ADVIL) 800 MG tablet Take 1 tablet (800 mg total) by mouth every 6 (six) hours as needed. (Patient not taking: Reported on 01/10/2024) 60 tablet 1    ibuprofen (ADVIL,MOTRIN) 400 MG tablet Take 1 tablet (400 mg total) by mouth every 6 (six) hours. (Patient not taking: Reported on 01/10/2024) 30 tablet 0   lisinopril (PRINIVIL,ZESTRIL) 40 MG tablet Take 20 mg by mouth daily.      morphine (MS CONTIN) 30 MG 12 hr tablet Take 30 mg by mouth every 12 (twelve) hours.     morphine (MSIR) 15 MG tablet Take 1 tablet (15 mg total) by mouth every 4 (four) hours as needed for severe pain. 12 tablet 0   nicotine (NICODERM CQ - DOSED IN MG/24 HOURS) 21 mg/24hr patch Place 1 patch (21 mg total) onto the skin daily. (Patient not taking: Reported on 11/13/2023) 28 patch 0   ondansetron (ZOFRAN) 4 MG tablet Take 1 tablet (4 mg total) by mouth every 6 (six) hours. 12 tablet 0   polyethylene glycol powder (GLYCOLAX/MIRALAX) powder Take 17 g by mouth daily as needed (for constipation.). (Patient not taking: Reported on 01/10/2024)     No current facility-administered medications on file prior to visit.     Objective   There were no vitals filed for this visit. GEN:  The patient appears stated age and is in NAD.  Neurological examination:  Orientation: The patient is alert and oriented x3. Cranial nerves: There is good facial symmetry. There is ***facial hypomimia.  The speech is fluent and clear. Soft palate rises symmetrically and there is no tongue deviation. Hearing is intact to conversational tone. Motor: Strength is at least antigravity x 4.   Shoulder shrug is equal and symmetric.  There is no pronator  drift.  Movement examination: Tone: unable Abnormal movements: ***There is independent left and right upper extremity resting tremor.  It does not increase with distraction procedures.  He does not have postural tremor.  He does have intention tremor, mod in the wing beating position.  He has mild tremor with Archimedes spirals.  Coordination:  There is no decremation with RAM's, with any form of RAMS, including alternating supination and pronation of the  forearm, hand opening and closing, finger taps, heel taps and toe taps.  Gait and Station: The patient ambulates in an antalgic fashion with his cane.    Follow up Instructions      -I discussed the assessment and treatment plan with the patient. The patient was provided an opportunity to ask questions and all were answered. The patient agreed with the plan and demonstrated an understanding of the instructions.   The patient was advised to call back or seek an in-person evaluation if the symptoms worsen or if the condition fails to improve as anticipated.    Total time spent on today's visit was ***minutes, including both face-to-face time and nonface-to-face time.  Time included that spent on review of records (prior notes available to me/labs/imaging if pertinent), discussing treatment and goals, answering patient's questions and coordinating care.   Kerin Salen, DO

## 2024-02-12 ENCOUNTER — Telehealth (INDEPENDENT_AMBULATORY_CARE_PROVIDER_SITE_OTHER): Payer: No Typology Code available for payment source | Admitting: Neurology

## 2024-02-12 DIAGNOSIS — G250 Essential tremor: Secondary | ICD-10-CM | POA: Diagnosis not present

## 2024-02-12 MED ORDER — PRIMIDONE 50 MG PO TABS: 50.0000 mg | ORAL_TABLET | Freq: Two times a day (BID) | ORAL | 1 refills | Status: DC

## 2024-02-12 NOTE — Patient Instructions (Signed)
Start primidone 50 mg - 1/2 tablet at bedtime for 1 week and then increase to 1 tablet at bedtime x 1 week and then you will take 1 tablet twice per day thereafter.  It was really good to see you today!  The physicians and staff at [facility] are committed to providing excellent care. You may receive a survey requesting feedback about your experience at our office. We strive to receive "very good" responses to the survey questions. If you feel that your experience would prevent you from giving the office a "very good " response, please contact our office to try to remedy the situation. We may be reached at [phone]. Thank you for taking the time out of your busy day to complete the survey.

## 2024-02-17 ENCOUNTER — Telehealth: Payer: Self-pay | Admitting: Neurology

## 2024-02-17 NOTE — Telephone Encounter (Signed)
 Alan Ripper called from the [place]. She is an Charity fundraiser. She needs [facility] They can send it via fax or Escript  Fax [phone]

## 2024-02-18 NOTE — Telephone Encounter (Signed)
 Called and number was busy

## 2024-02-19 NOTE — Telephone Encounter (Signed)
 Number was busy . Calling to see where I am sending this to so it can be sent via computer.

## 2024-02-21 ENCOUNTER — Other Ambulatory Visit: Payer: Self-pay

## 2024-02-21 MED ORDER — PRIMIDONE 50 MG PO TABS: 50.0000 mg | ORAL_TABLET | Freq: Two times a day (BID) | ORAL | 1 refills | Status: DC

## 2024-02-21 NOTE — Telephone Encounter (Signed)
 Faxed

## 2024-03-04 DIAGNOSIS — Z0000 Encounter for general adult medical examination without abnormal findings: Secondary | ICD-10-CM | POA: Diagnosis not present

## 2024-03-04 DIAGNOSIS — Z720 Tobacco use: Secondary | ICD-10-CM | POA: Diagnosis not present

## 2024-03-04 DIAGNOSIS — E782 Mixed hyperlipidemia: Secondary | ICD-10-CM | POA: Diagnosis not present

## 2024-03-04 DIAGNOSIS — I10 Essential (primary) hypertension: Secondary | ICD-10-CM | POA: Diagnosis not present

## 2024-03-04 DIAGNOSIS — M1990 Unspecified osteoarthritis, unspecified site: Secondary | ICD-10-CM | POA: Diagnosis not present

## 2024-03-04 DIAGNOSIS — E1169 Type 2 diabetes mellitus with other specified complication: Secondary | ICD-10-CM | POA: Diagnosis not present

## 2024-03-04 DIAGNOSIS — G250 Essential tremor: Secondary | ICD-10-CM | POA: Diagnosis not present

## 2024-04-08 ENCOUNTER — Ambulatory Visit (INDEPENDENT_AMBULATORY_CARE_PROVIDER_SITE_OTHER)

## 2024-04-08 DIAGNOSIS — E889 Metabolic disorder, unspecified: Secondary | ICD-10-CM

## 2024-06-16 ENCOUNTER — Telehealth: Payer: Self-pay | Admitting: Neurology

## 2024-06-16 NOTE — Telephone Encounter (Signed)
 Printed and will send call wife to let her know

## 2024-06-16 NOTE — Telephone Encounter (Signed)
 [facility]s [facility] cld for [facility] us  in July, please send RFS form to fax [phone] [facility]

## 2024-06-18 NOTE — Telephone Encounter (Signed)
 Faxed to number given and called wife to let her know I faxed paperwork to [facility]

## 2024-06-20 NOTE — Progress Notes (Unsigned)
 Assessment/Plan:   Tremor  -DaTscan  completed February 07, 2024 was normal.             -Tremor with mixed characteristics.  However, given normal DaTscan , rest tremor is likely just due to longstanding essential tremor.             -Patient has been on propranolol, 80 mg without relief.             -increase Primidone , 50 mg, 2 po bid.  Titration schedule given.  He is going to take to the [place] [facility].  -he is going through [place] for disability and ask about ability to work.  I don't think he can but also don't think its reasonable to ask someone of his age group to do so.    Subjective:   Russell Luna was seen today in follow-up for tremor.  His wife supplements the history.  Primidone  was started last visit and we worked to 50 mg twice per day.  He reports no side effects with the medication.  However, it hasn't helped.    Current movement disorder medications: Primidone , 50 mg twice per day (started last visit)   ALLERGIES:   Allergies  Allergen Reactions   Naproxen Other (See Comments)    GI upset    CURRENT MEDICATIONS:  Current Outpatient Medications  Medication Instructions   atorvastatin  (LIPITOR) 80 MG tablet TAKE ONE TABLET BY MOUTH AT BEDTIME FOR CHOLESTEROL   docusate sodium  (COLACE) 200 mg, 2 times daily PRN   hydrochlorothiazide  (HYDRODIURIL ) 25 mg, Daily   ibuprofen  (ADVIL ) 800 mg, Oral, Every 6 hours PRN   lisinopril  (ZESTRIL ) 20 mg, Daily   morphine  (MS CONTIN ) 30 mg, Every 12 hours   morphine  (MSIR) 15 mg, Oral, Every 4 hours PRN   ondansetron  (ZOFRAN ) 4 mg, Oral, Every 6 hours   primidone  (MYSOLINE ) 50 mg, Oral, 2 times daily, 1/2 po q hs x 1 week, then 1 po q hs x 1 week, then 1 po bid    Objective:   PHYSICAL EXAMINATION:    VITALS:   Vitals:   06/23/24 0906  BP: 132/60  Pulse: 74  SpO2: 97%  Weight: 142 lb 9.6 oz (64.7 kg)  Height: 5' 11 (1.803 m)     GEN:  The patient appears stated age and is in NAD. HEENT:  Normocephalic,  atraumatic.  The mucous membranes are moist. The superficial temporal arteries are without ropiness or tenderness. CV:  RRR Lungs:  CTAB Neck/HEME:  There are no carotid bruits bilaterally.  Neurological examination:  Orientation: The patient is alert and oriented x3.  Cranial nerves: There is good facial symmetry.  Extraocular muscles are intact. The visual fields are full to confrontational testing. The speech is fluent and clear. Soft palate rises symmetrically and there is no tongue deviation. Hearing is decreased to conversational tone. Sensation: Sensation is intact to light touch throughout  Motor: Strength is at least antigravity x 4.  Movement examination: Tone: There is normal tone in the bilateral upper extremities.  The tone in the lower extremities is normal.  Abnormal movements: There is independent left and right upper extremity resting tremor, overall mild and intermittent.  It does not increase with distraction procedures.  He does not have postural tremor.  He does have intention tremor, but this is improved and mild to mod on the R and rare on the L.  Archimedes spirals are better on the R.   Coordination:  There is no  decremation with RAM's, with any form of RAMS, including alternating supination and pronation of the forearm, hand opening and closing, finger taps, heel taps and toe taps.  Gait and Station: The patient ambulates well with his cane.  He is a bit antalgic.   I have reviewed and interpreted the following labs independently   Chemistry      Component Value Date/Time   NA 134 (L) 01/10/2024 1203   K 3.5 01/10/2024 1203   CL 99 01/10/2024 1203   CO2 28 01/10/2024 1203   BUN 13 01/10/2024 1203   CREATININE 1.03 01/10/2024 1203      Component Value Date/Time   CALCIUM  9.3 01/10/2024 1203   ALKPHOS 73 01/10/2024 1203   AST 34 01/10/2024 1203   ALT 60 (H) 01/10/2024 1203   BILITOT 2.0 (H) 01/10/2024 1203      No results found for: TSH Lab Results   Component Value Date   WBC 11.2 (H) 01/10/2024   HGB 16.3 01/10/2024   HCT 48.8 01/10/2024   MCV 93.7 01/10/2024   PLT 256 01/10/2024      Total time spent on today's visit was 30 minutes, including both face-to-face time and nonface-to-face time.  Time included that spent on review of records (prior notes available to me/labs/imaging if pertinent), discussing treatment and goals, answering patient's questions and coordinating care.  Cc:  Loreli Kins, MD

## 2024-06-23 ENCOUNTER — Ambulatory Visit (INDEPENDENT_AMBULATORY_CARE_PROVIDER_SITE_OTHER): Payer: No Typology Code available for payment source | Admitting: Neurology

## 2024-06-23 VITALS — BP 132/60 | HR 74 | Ht 71.0 in | Wt 142.6 lb

## 2024-06-23 DIAGNOSIS — G250 Essential tremor: Secondary | ICD-10-CM | POA: Diagnosis not present

## 2024-06-23 MED ORDER — PRIMIDONE 50 MG PO TABS: 100.0000 mg | ORAL_TABLET | Freq: Two times a day (BID) | ORAL | 1 refills | Status: DC

## 2024-06-23 NOTE — Patient Instructions (Signed)
 Increase primidone  50 mg, 2 in the AM and 1 at night for a week and then increase to primidone  50 mg, 2 in the AM and 2 at night thereafter

## 2024-09-18 ENCOUNTER — Telehealth: Payer: Self-pay | Admitting: Radiology

## 2024-09-18 NOTE — Telephone Encounter (Signed)
 Copied from CRM [id number]. Topic: Appointments - Scheduling Inquiry for Clinic >> Sep 17, 2024 11:56 AM Anairis L wrote: Reason for CRM: Patient is calling in to schedule hospital f-up with [name], no app available.   Cardiologist also wants her to see pcp because her B12 is low and wants patient to receive B12 shots. Vitamin D is low as well as they want her lasik to be adjusted. They believe she is taking too much.

## 2025-01-04 NOTE — Progress Notes (Unsigned)
 "  Assessment/Plan:   Tremor  -DaTscan  completed February 07, 2024 was normal.             -Tremor with mixed characteristics.  However, given normal DaTscan , rest tremor is likely just due to longstanding essential tremor.             -Patient has been on propranolol, 80 mg without relief.             -*** Primidone , 50 mg, 2 po bid.  Titration schedule given.  He is going to take to the [place] [facility].  -he is going through [place] for disability and ask about ability to work.  I don't think he can but also don't think its reasonable to ask someone of his age group to do so.    Subjective:   Russell Luna was seen today in follow-up for tremor.  His wife supplements the history.  His primidone  was increased last visit.  Current movement disorder medications: Primidone , 50 mg twice, 2 tablets twice per day  Prior medications: Propranolol, 80 mg daily   ALLERGIES:   Allergies  Allergen Reactions   Naproxen Other (See Comments)    GI upset    CURRENT MEDICATIONS:  Current Outpatient Medications  Medication Instructions   atorvastatin  (LIPITOR) 80 MG tablet TAKE ONE TABLET BY MOUTH AT BEDTIME FOR CHOLESTEROL   docusate sodium  (COLACE) 200 mg, 2 times daily PRN   hydrochlorothiazide  (HYDRODIURIL ) 25 mg, Daily   ibuprofen  (ADVIL ) 800 mg, Oral, Every 6 hours PRN   lisinopril  (ZESTRIL ) 20 mg, Daily   morphine  (MS CONTIN ) 30 mg, Every 12 hours   morphine  (MSIR) 15 mg, Oral, Every 4 hours PRN   ondansetron  (ZOFRAN ) 4 mg, Oral, Every 6 hours   primidone  (MYSOLINE ) 100 mg, Oral, 2 times daily    Objective:   PHYSICAL EXAMINATION:    VITALS:   There were no vitals filed for this visit.    GEN:  The patient appears stated age and is in NAD. HEENT:  Normocephalic, atraumatic.  The mucous membranes are moist. The superficial temporal arteries are without ropiness or tenderness. CV:  RRR Lungs:  CTAB Neck/HEME:  There are no carotid bruits bilaterally.  Neurological  examination:  Orientation: The patient is alert and oriented x3.  Cranial nerves: There is good facial symmetry.  Extraocular muscles are intact. The visual fields are full to confrontational testing. The speech is fluent and clear. Soft palate rises symmetrically and there is no tongue deviation. Hearing is decreased to conversational tone. Sensation: Sensation is intact to light touch throughout  Motor: Strength is at least antigravity x 4.  Movement examination: Tone: There is normal tone in the bilateral upper extremities.  The tone in the lower extremities is normal.  Abnormal movements: There is independent left and right upper extremity resting tremor, overall mild and intermittent.  It does not increase with distraction procedures.  He does not have postural tremor.  He does have intention tremor, but this is improved and mild to mod on the R and rare on the L.  Archimedes spirals are better on the R.   Coordination:  There is no decremation with RAM's, with any form of RAMS, including alternating supination and pronation of the forearm, hand opening and closing, finger taps, heel taps and toe taps.  Gait and Station: The patient ambulates well with his cane.  He is a bit antalgic.   I have reviewed and interpreted the following labs independently  Chemistry      Component Value Date/Time   NA 134 (L) 01/10/2024 1203   K 3.5 01/10/2024 1203   CL 99 01/10/2024 1203   CO2 28 01/10/2024 1203   BUN 13 01/10/2024 1203   CREATININE 1.03 01/10/2024 1203      Component Value Date/Time   CALCIUM  9.3 01/10/2024 1203   ALKPHOS 73 01/10/2024 1203   AST 34 01/10/2024 1203   ALT 60 (H) 01/10/2024 1203   BILITOT 2.0 (H) 01/10/2024 1203      No results found for: TSH Lab Results  Component Value Date   WBC 11.2 (H) 01/10/2024   HGB 16.3 01/10/2024   HCT 48.8 01/10/2024   MCV 93.7 01/10/2024   PLT 256 01/10/2024      Total time spent on today's visit was *** minutes, including  both face-to-face time and nonface-to-face time.  Time included that spent on review of records (prior notes available to me/labs/imaging if pertinent), discussing treatment and goals, answering patient's questions and coordinating care.  Cc:  Loreli Kins, MD  "

## 2025-01-05 ENCOUNTER — Other Ambulatory Visit: Payer: Self-pay | Admitting: Neurology

## 2025-01-05 DIAGNOSIS — G250 Essential tremor: Secondary | ICD-10-CM

## 2025-01-06 ENCOUNTER — Ambulatory Visit (INDEPENDENT_AMBULATORY_CARE_PROVIDER_SITE_OTHER): Admitting: Neurology

## 2025-01-06 ENCOUNTER — Encounter: Payer: Self-pay | Admitting: Neurology

## 2025-01-06 VITALS — BP 126/84 | HR 68 | Wt 217.8 lb

## 2025-01-06 DIAGNOSIS — R251 Tremor, unspecified: Secondary | ICD-10-CM

## 2025-01-06 NOTE — Patient Instructions (Signed)
 Decrease primidone  50 mg, 1 tablet twice per day x 1 week and then 1 tablet once per day for a week and then STOP primidone 

## 2025-03-19 ENCOUNTER — Ambulatory Visit: Payer: Self-pay | Admitting: Neurology
